# Patient Record
Sex: Male | Born: 1950 | Race: White | Hispanic: No | Marital: Married | Smoking: Former smoker
Health system: Southern US, Community
[De-identification: ages and names within clinical notes are randomized; demographics above are authoritative.]

## PROBLEM LIST (undated history)

## (undated) DIAGNOSIS — IMO0001 Reserved for inherently not codable concepts without codable children: Secondary | ICD-10-CM

## (undated) DIAGNOSIS — M1990 Unspecified osteoarthritis, unspecified site: Secondary | ICD-10-CM

## (undated) DIAGNOSIS — I82409 Acute embolism and thrombosis of unspecified deep veins of unspecified lower extremity: Secondary | ICD-10-CM

## (undated) DIAGNOSIS — G4730 Sleep apnea, unspecified: Secondary | ICD-10-CM

## (undated) DIAGNOSIS — D682 Hereditary deficiency of other clotting factors: Secondary | ICD-10-CM

## (undated) DIAGNOSIS — K56609 Unspecified intestinal obstruction, unspecified as to partial versus complete obstruction: Secondary | ICD-10-CM

## (undated) HISTORY — PX: SHOULDER SURGERY: SHX246

## (undated) HISTORY — PX: DIAGNOSTIC LAPAROSCOPY: SUR761

## (undated) HISTORY — PX: COLONOSCOPY: SHX174

## (undated) HISTORY — PX: HERNIA REPAIR: SHX51

---

## 2003-12-16 ENCOUNTER — Ambulatory Visit (HOSPITAL_BASED_OUTPATIENT_CLINIC_OR_DEPARTMENT_OTHER): Admission: RE | Admit: 2003-12-16 | Discharge: 2003-12-16 | Payer: Self-pay | Admitting: Family Medicine

## 2004-08-22 ENCOUNTER — Emergency Department (HOSPITAL_COMMUNITY): Admission: EM | Admit: 2004-08-22 | Discharge: 2004-08-22 | Payer: Self-pay | Admitting: Emergency Medicine

## 2004-09-24 ENCOUNTER — Encounter: Admission: RE | Admit: 2004-09-24 | Discharge: 2004-09-24 | Payer: Self-pay | Admitting: Gastroenterology

## 2004-09-28 ENCOUNTER — Ambulatory Visit (HOSPITAL_COMMUNITY): Admission: RE | Admit: 2004-09-28 | Discharge: 2004-09-28 | Payer: Self-pay | Admitting: Gastroenterology

## 2005-03-22 ENCOUNTER — Encounter: Admission: RE | Admit: 2005-03-22 | Discharge: 2005-03-22 | Payer: Self-pay | Admitting: Family Medicine

## 2005-03-22 ENCOUNTER — Emergency Department (HOSPITAL_COMMUNITY): Admission: EM | Admit: 2005-03-22 | Discharge: 2005-03-22 | Payer: Self-pay | Admitting: Emergency Medicine

## 2005-04-03 ENCOUNTER — Ambulatory Visit: Payer: Self-pay | Admitting: Oncology

## 2005-11-04 ENCOUNTER — Ambulatory Visit: Payer: Self-pay | Admitting: Oncology

## 2009-09-02 ENCOUNTER — Inpatient Hospital Stay (HOSPITAL_COMMUNITY): Admission: EM | Admit: 2009-09-02 | Discharge: 2009-09-04 | Payer: Self-pay | Admitting: Emergency Medicine

## 2009-10-03 ENCOUNTER — Ambulatory Visit: Payer: Self-pay | Admitting: Oncology

## 2009-10-04 LAB — CBC WITH DIFFERENTIAL/PLATELET
BASO%: 1.2 % (ref 0.0–2.0)
Basophils Absolute: 0.1 10*3/uL (ref 0.0–0.1)
EOS%: 4.8 % (ref 0.0–7.0)
Eosinophils Absolute: 0.3 10*3/uL (ref 0.0–0.5)
HCT: 44.2 % (ref 38.4–49.9)
HGB: 15.4 g/dL (ref 13.0–17.1)
LYMPH%: 39.1 % (ref 14.0–49.0)
MCH: 31.7 pg (ref 27.2–33.4)
MCHC: 34.8 g/dL (ref 32.0–36.0)
MCV: 91.3 fL (ref 79.3–98.0)
MONO#: 0.4 10*3/uL (ref 0.1–0.9)
MONO%: 6.8 % (ref 0.0–14.0)
NEUT#: 2.9 10*3/uL (ref 1.5–6.5)
NEUT%: 48.1 % (ref 39.0–75.0)
Platelets: 167 10*3/uL (ref 140–400)
RBC: 4.84 10*6/uL (ref 4.20–5.82)
RDW: 12.7 % (ref 11.0–14.6)
WBC: 5.9 10*3/uL (ref 4.0–10.3)
lymph#: 2.3 10*3/uL (ref 0.9–3.3)

## 2009-10-04 LAB — PROTIME-INR
INR: 1.5 — ABNORMAL LOW (ref 2.00–3.50)
Protime: 18 Seconds — ABNORMAL HIGH (ref 10.6–13.4)

## 2009-10-11 LAB — CBC WITH DIFFERENTIAL/PLATELET
BASO%: 0.6 % (ref 0.0–2.0)
Basophils Absolute: 0 10*3/uL (ref 0.0–0.1)
EOS%: 5.7 % (ref 0.0–7.0)
Eosinophils Absolute: 0.4 10*3/uL (ref 0.0–0.5)
HCT: 42.9 % (ref 38.4–49.9)
HGB: 14.7 g/dL (ref 13.0–17.1)
LYMPH%: 33.6 % (ref 14.0–49.0)
MCH: 30.7 pg (ref 27.2–33.4)
MCHC: 34.3 g/dL (ref 32.0–36.0)
MCV: 89.6 fL (ref 79.3–98.0)
MONO#: 0.5 10*3/uL (ref 0.1–0.9)
MONO%: 8.6 % (ref 0.0–14.0)
NEUT#: 3.2 10*3/uL (ref 1.5–6.5)
NEUT%: 51.5 % (ref 39.0–75.0)
Platelets: 186 10*3/uL (ref 140–400)
RBC: 4.79 10*6/uL (ref 4.20–5.82)
RDW: 12.3 % (ref 11.0–14.6)
WBC: 6.2 10*3/uL (ref 4.0–10.3)
lymph#: 2.1 10*3/uL (ref 0.9–3.3)

## 2009-10-11 LAB — PROTIME-INR
INR: 1.5 — ABNORMAL LOW (ref 2.00–3.50)
Protime: 18 Seconds — ABNORMAL HIGH (ref 10.6–13.4)

## 2009-10-16 LAB — PROTIME-INR
INR: 1.9 — ABNORMAL LOW (ref 2.00–3.50)
Protime: 22.8 Seconds — ABNORMAL HIGH (ref 10.6–13.4)

## 2009-10-24 LAB — PROTIME-INR
INR: 2 (ref 2.00–3.50)
Protime: 24 Seconds — ABNORMAL HIGH (ref 10.6–13.4)

## 2009-11-03 ENCOUNTER — Ambulatory Visit: Payer: Self-pay | Admitting: Oncology

## 2009-11-07 LAB — PROTIME-INR
INR: 2.1 (ref 2.00–3.50)
Protime: 25.2 Seconds — ABNORMAL HIGH (ref 10.6–13.4)

## 2010-11-11 LAB — URINALYSIS, ROUTINE W REFLEX MICROSCOPIC
Bilirubin Urine: NEGATIVE
Glucose, UA: NEGATIVE mg/dL
Hgb urine dipstick: NEGATIVE
Ketones, ur: 40 mg/dL — AB
Leukocytes, UA: NEGATIVE
Nitrite: NEGATIVE
Protein, ur: 30 mg/dL — AB
Specific Gravity, Urine: 1.026 (ref 1.005–1.030)
Urobilinogen, UA: 1 mg/dL (ref 0.0–1.0)
pH: 7.5 (ref 5.0–8.0)

## 2010-11-11 LAB — COMPREHENSIVE METABOLIC PANEL
ALT: 35 U/L (ref 0–53)
AST: 26 U/L (ref 0–37)
Albumin: 4.1 g/dL (ref 3.5–5.2)
Alkaline Phosphatase: 70 U/L (ref 39–117)
BUN: 15 mg/dL (ref 6–23)
CO2: 30 mEq/L (ref 19–32)
Calcium: 10.8 mg/dL — ABNORMAL HIGH (ref 8.4–10.5)
Chloride: 97 mEq/L (ref 96–112)
Creatinine, Ser: 1.14 mg/dL (ref 0.4–1.5)
GFR calc Af Amer: >60 mL/min (ref >60)
GFR calc non Af Amer: >60 mL/min (ref >60)
Glucose, Bld: 131 mg/dL — ABNORMAL HIGH (ref 70–99)
Potassium: 3.7 mEq/L (ref 3.5–5.1)
Sodium: 138 mEq/L (ref 135–145)
Total Bilirubin: 1.5 mg/dL — ABNORMAL HIGH (ref 0.3–1.2)
Total Protein: 7.8 g/dL (ref 6.0–8.3)

## 2010-11-11 LAB — CBC
HCT: 38.7 % — ABNORMAL LOW (ref 39.0–52.0)
HCT: 41 % (ref 39.0–52.0)
HCT: 51.3 % (ref 39.0–52.0)
Hemoglobin: 13.5 g/dL (ref 13.0–17.0)
Hemoglobin: 14.1 g/dL (ref 13.0–17.0)
Hemoglobin: 17.4 g/dL — ABNORMAL HIGH (ref 13.0–17.0)
MCHC: 33.8 g/dL (ref 30.0–36.0)
MCHC: 33.8 g/dL (ref 30.0–36.0)
MCHC: 34.7 g/dL (ref 30.0–36.0)
MCV: 90.9 fL (ref 78.0–100.0)
MCV: 91.6 fL (ref 78.0–100.0)
MCV: 92.1 fL (ref 78.0–100.0)
Platelets: 192 10*3/uL (ref 150–400)
Platelets: 204 10*3/uL (ref 150–400)
Platelets: 259 10*3/uL (ref 150–400)
RBC: 4.26 MIL/uL (ref 4.22–5.81)
RBC: 4.45 MIL/uL (ref 4.22–5.81)
RBC: 5.6 MIL/uL (ref 4.22–5.81)
RDW: 12.7 % (ref 11.5–15.5)
RDW: 12.8 % (ref 11.5–15.5)
RDW: 12.9 % (ref 11.5–15.5)
WBC: 14.1 10*3/uL — ABNORMAL HIGH (ref 4.0–10.5)
WBC: 14.5 10*3/uL — ABNORMAL HIGH (ref 4.0–10.5)
WBC: 8.3 10*3/uL (ref 4.0–10.5)

## 2010-11-11 LAB — PROTIME-INR
INR: 1.37 (ref 0.00–1.49)
INR: 1.61 — ABNORMAL HIGH (ref 0.00–1.49)
Prothrombin Time: 16.8 seconds — ABNORMAL HIGH (ref 11.6–15.2)
Prothrombin Time: 19 seconds — ABNORMAL HIGH (ref 11.6–15.2)

## 2010-11-11 LAB — DIFFERENTIAL
Basophils Absolute: 0.1 10*3/uL (ref 0.0–0.1)
Basophils Relative: 1 % (ref 0–1)
Eosinophils Absolute: 0 10*3/uL (ref 0.0–0.7)
Eosinophils Relative: 0 % (ref 0–5)
Lymphocytes Relative: 14 % (ref 12–46)
Lymphs Abs: 2 10*3/uL (ref 0.7–4.0)
Monocytes Absolute: 0.8 10*3/uL (ref 0.1–1.0)
Monocytes Relative: 6 % (ref 3–12)
Neutro Abs: 11.6 10*3/uL — ABNORMAL HIGH (ref 1.7–7.7)
Neutrophils Relative %: 80 % — ABNORMAL HIGH (ref 43–77)

## 2010-11-11 LAB — BASIC METABOLIC PANEL
BUN: 15 mg/dL (ref 6–23)
CO2: 32 mEq/L (ref 19–32)
Calcium: 8.7 mg/dL (ref 8.4–10.5)
Chloride: 103 mEq/L (ref 96–112)
Creatinine, Ser: 1.07 mg/dL (ref 0.4–1.5)
GFR calc Af Amer: >60 mL/min (ref >60)
GFR calc non Af Amer: >60 mL/min (ref >60)
Glucose, Bld: 115 mg/dL — ABNORMAL HIGH (ref 70–99)
Potassium: 4.2 mEq/L (ref 3.5–5.1)
Sodium: 139 mEq/L (ref 135–145)

## 2010-11-11 LAB — URINE MICROSCOPIC-ADD ON
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
## 2013-04-29 ENCOUNTER — Telehealth: Payer: Self-pay | Admitting: *Deleted

## 2013-04-29 NOTE — Telephone Encounter (Signed)
Received vm call from Katie/Dr. Earl Gala regarding need for f/u & asking if new referral needs to be sent or could f/u be scheduled without new referral.  Returned call today @ 11am & Florentina Addison states that Dr Earl Gala has dictated a note to Dr Cyndie Chime requesting f/u for NAC & lovenox bridge for colonoscopy.  Informed that we would discuss with Dr Cyndie Chime next week upon his return & she will let pt know.  She reports that she did fax a referral & Dr Newell Coral note this am.  Note to Dr Cyndie Chime.

## 2013-05-31 ENCOUNTER — Telehealth: Payer: Self-pay | Admitting: Oncology

## 2013-05-31 NOTE — Telephone Encounter (Signed)
CALLED TO SCHEDULE APPT PER PT WILL NOT NEED TO MAKE APPT. WILL NOTIFY REFERRAL OFFICE.

## 2013-06-08 ENCOUNTER — Ambulatory Visit: Payer: Self-pay

## 2013-06-08 ENCOUNTER — Encounter: Payer: Self-pay | Admitting: Oncology

## 2013-10-23 ENCOUNTER — Encounter: Payer: Self-pay | Admitting: Oncology

## 2015-05-09 ENCOUNTER — Encounter (HOSPITAL_COMMUNITY)
Admission: RE | Admit: 2015-05-09 | Discharge: 2015-05-09 | Disposition: A | Discharge status: Home / Self Care | Payer: BLUE CROSS/BLUE SHIELD | Source: Ambulatory Visit | Attending: Orthopedic Surgery | Admitting: Orthopedic Surgery

## 2015-05-09 ENCOUNTER — Encounter (HOSPITAL_COMMUNITY): Payer: Self-pay

## 2015-05-09 DIAGNOSIS — M7541 Impingement syndrome of right shoulder: Secondary | ICD-10-CM | POA: Diagnosis not present

## 2015-05-09 DIAGNOSIS — M19011 Primary osteoarthritis, right shoulder: Secondary | ICD-10-CM | POA: Insufficient documentation

## 2015-05-09 DIAGNOSIS — Z01812 Encounter for preprocedural laboratory examination: Secondary | ICD-10-CM | POA: Insufficient documentation

## 2015-05-09 HISTORY — DX: Hereditary deficiency of other clotting factors: D68.2

## 2015-05-09 HISTORY — DX: Acute embolism and thrombosis of unspecified deep veins of unspecified lower extremity: I82.409

## 2015-05-09 HISTORY — DX: Reserved for inherently not codable concepts without codable children: IMO0001

## 2015-05-09 HISTORY — DX: Unspecified osteoarthritis, unspecified site: M19.90

## 2015-05-09 LAB — CBC WITH DIFFERENTIAL/PLATELET
BASOS ABS: 0 10*3/uL (ref 0.0–0.1)
BASOS PCT: 1 % (ref 0–1)
EOS PCT: 4 % (ref 0–5)
Eosinophils Absolute: 0.3 10*3/uL (ref 0.0–0.7)
HCT: 45.1 % (ref 39.0–52.0)
Hemoglobin: 15.8 g/dL (ref 13.0–17.0)
Lymphocytes Relative: 45 % (ref 12–46)
Lymphs Abs: 3 10*3/uL (ref 0.7–4.0)
MCH: 32 pg (ref 26.0–34.0)
MCHC: 35 g/dL (ref 30.0–36.0)
MCV: 91.3 fL (ref 78.0–100.0)
MONO ABS: 0.7 10*3/uL (ref 0.1–1.0)
Monocytes Relative: 10 % (ref 3–12)
Neutro Abs: 2.6 10*3/uL (ref 1.7–7.7)
Neutrophils Relative %: 40 % — ABNORMAL LOW (ref 43–77)
PLATELETS: 161 10*3/uL (ref 150–400)
RBC: 4.94 MIL/uL (ref 4.22–5.81)
RDW: 12.5 % (ref 11.5–15.5)
WBC: 6.6 10*3/uL (ref 4.0–10.5)

## 2015-05-09 LAB — COMPREHENSIVE METABOLIC PANEL
ALBUMIN: 4 g/dL (ref 3.5–5.0)
ALT: 24 U/L (ref 17–63)
AST: 22 U/L (ref 15–41)
Alkaline Phosphatase: 60 U/L (ref 38–126)
Anion gap: 7 (ref 5–15)
BUN: 12 mg/dL (ref 6–20)
CHLORIDE: 104 mmol/L (ref 101–111)
CO2: 27 mmol/L (ref 22–32)
Calcium: 9.1 mg/dL (ref 8.9–10.3)
Creatinine, Ser: 1.06 mg/dL (ref 0.61–1.24)
GFR calc Af Amer: >60 mL/min (ref >60)
GFR calc non Af Amer: >60 mL/min (ref >60)
GLUCOSE: 94 mg/dL (ref 65–99)
POTASSIUM: 4 mmol/L (ref 3.5–5.1)
Sodium: 138 mmol/L (ref 135–145)
Total Bilirubin: 0.9 mg/dL (ref 0.3–1.2)
Total Protein: 6.8 g/dL (ref 6.5–8.1)

## 2015-05-09 LAB — APTT: APTT: 37 s (ref 24–37)

## 2015-05-09 NOTE — Pre-Procedure Instructions (Addendum)
    Shawn Farley  05/09/2015    Your procedure is scheduled on Thursday, Septmeber 22..  Report to [facility] Admitting at 10:50 A.M.               Your surgery is scheduled for 12:50am   Call this number if you have problems the morning of surgery:[phone]                   For any other questions, please call [phone], Monday - Friday 8 AM - 4 PM.   Remember:  Do not eat food or drink liquids after midnight Wednesday, September 21.   Take these medicines the morning of surgery with A SIP OF WATER : None                  Stop Coumadin 4 days prior to surgery and have PT/INR drawn in Dr Kevan Ny office on September 21.    Do not wear jewelry, make-up or nail polish.   Do not wear lotions, powders, or perfumes.  You may wear deodorant.   Do not shave 48 hours prior to surgery.  Men may shave face and neck.   Do not bring valuables to the hospital.   [facility] is not responsible for any belongings or valuables.  Contacts, dentures or bridgework may not be worn into surgery.  Leave your suitcase in the car.  After surgery it may be brought to your room.  For patients admitted to the hospital, discharge time will be determined by your treatment team.  Patients discharged the day of surgery will not be allowed to drive home.   Name and phone number of your driver:   - Special instructions:  Review  [facility] - Preparing For Surgery.  Please read over the following fact sheets that you were given. Pain Booklet, Coughing and Deep Breathing and Surgical Site Infection Prevention

## 2015-05-09 NOTE — Progress Notes (Signed)
I read to Shawn Farley the instructions from Dr Jerelyn Scott regarding stopping Coumadin and when to begin Levonox,.  The instructions were sent to Dr Rennis Chris and then forwarded to Shawn Farley.  Shawn Farley is concerned about being totally off blood thinner.  I called Dr Kevan Ny' office and Dr Kevan Ny took the call and was able to speak with patient regarding his concerns.  Shawn Farley is going to Dr Kevan Ny office on 05/11/15 to discuss this further.  Patient is scheduled to go to Dr Kevan Ny office on 05/17/15 to have PT/INR drawn. I faxed a request to Dr Kevan Ny office requesting results of the 05/17/15 PT/INR results.

## 2015-05-18 ENCOUNTER — Ambulatory Visit (HOSPITAL_COMMUNITY): Payer: BLUE CROSS/BLUE SHIELD | Admitting: Anesthesiology

## 2015-05-18 ENCOUNTER — Encounter (HOSPITAL_COMMUNITY): Payer: Self-pay | Admitting: *Deleted

## 2015-05-18 ENCOUNTER — Encounter (HOSPITAL_COMMUNITY)
Admission: RE | Disposition: A | Discharge status: Home / Self Care | Payer: Self-pay | Source: Ambulatory Visit | Attending: Orthopedic Surgery

## 2015-05-18 ENCOUNTER — Ambulatory Visit (HOSPITAL_COMMUNITY)
Admission: RE | Admit: 2015-05-18 | Discharge: 2015-05-18 | Disposition: A | Discharge status: Home / Self Care | Payer: BLUE CROSS/BLUE SHIELD | Source: Ambulatory Visit | Attending: Orthopedic Surgery | Admitting: Orthopedic Surgery

## 2015-05-18 DIAGNOSIS — M19011 Primary osteoarthritis, right shoulder: Secondary | ICD-10-CM | POA: Diagnosis not present

## 2015-05-18 DIAGNOSIS — M7541 Impingement syndrome of right shoulder: Secondary | ICD-10-CM | POA: Insufficient documentation

## 2015-05-18 DIAGNOSIS — Z7901 Long term (current) use of anticoagulants: Secondary | ICD-10-CM | POA: Diagnosis not present

## 2015-05-18 DIAGNOSIS — M75111 Incomplete rotator cuff tear or rupture of right shoulder, not specified as traumatic: Secondary | ICD-10-CM | POA: Diagnosis not present

## 2015-05-18 DIAGNOSIS — F17210 Nicotine dependence, cigarettes, uncomplicated: Secondary | ICD-10-CM | POA: Insufficient documentation

## 2015-05-18 SURGERY — SHOULDER ARTHROSCOPY WITH SUBACROMIAL DECOMPRESSION AND DISTAL CLAVICLE EXCISION
Anesthesia: General | Site: Shoulder | Laterality: Right

## 2015-05-18 MED ORDER — MIDAZOLAM HCL 2 MG/2ML IJ SOLN
2.0000 mg | Freq: Once | INTRAMUSCULAR | Status: AC
  Administered 2015-05-18: 1 mg via INTRAVENOUS
  Filled 2015-05-18: qty 2

## 2015-05-18 MED ORDER — NEOSTIGMINE METHYLSULFATE 10 MG/10ML IV SOLN
INTRAVENOUS | Status: DC | PRN
  Administered 2015-05-18: 4 mg via INTRAVENOUS

## 2015-05-18 MED ORDER — ROCURONIUM BROMIDE 100 MG/10ML IV SOLN
INTRAVENOUS | Status: DC | PRN
  Administered 2015-05-18: 50 mg via INTRAVENOUS

## 2015-05-18 MED ORDER — CEFAZOLIN SODIUM-DEXTROSE 2-3 GM-% IV SOLR
2.0000 g | INTRAVENOUS | Status: AC
  Administered 2015-05-18: 2 g via INTRAVENOUS
  Filled 2015-05-18: qty 50

## 2015-05-18 MED ORDER — ONDANSETRON HCL 4 MG/2ML IJ SOLN
INTRAMUSCULAR | Status: DC | PRN
  Administered 2015-05-18: 4 mg via INTRAVENOUS

## 2015-05-18 MED ORDER — GLYCOPYRROLATE 0.2 MG/ML IJ SOLN
INTRAMUSCULAR | Status: DC | PRN
  Administered 2015-05-18: .6 mg via INTRAVENOUS

## 2015-05-18 MED ORDER — PROPOFOL 10 MG/ML IV BOLUS
INTRAVENOUS | Status: DC | PRN
  Administered 2015-05-18: 200 mg via INTRAVENOUS

## 2015-05-18 MED ORDER — SODIUM CHLORIDE 0.9 % IR SOLN
Status: DC | PRN
  Administered 2015-05-18: 3000 mL

## 2015-05-18 MED ORDER — FENTANYL CITRATE (PF) 250 MCG/5ML IJ SOLN
INTRAMUSCULAR | Status: DC | PRN
  Administered 2015-05-18: 100 ug via INTRAVENOUS

## 2015-05-18 MED ORDER — MIDAZOLAM HCL 5 MG/5ML IJ SOLN
INTRAMUSCULAR | Status: DC | PRN
  Administered 2015-05-18: 2 mg via INTRAVENOUS

## 2015-05-18 MED ORDER — CHLORHEXIDINE GLUCONATE 4 % EX LIQD: 60.0000 mL | Freq: Once | CUTANEOUS | Status: DC

## 2015-05-18 MED ORDER — LACTATED RINGERS IV SOLN
INTRAVENOUS | Status: DC
  Administered 2015-05-18 (×2): via INTRAVENOUS

## 2015-05-18 MED ORDER — PROPOFOL 10 MG/ML IV BOLUS
INTRAVENOUS | Status: AC
  Filled 2015-05-18: qty 20

## 2015-05-18 MED ORDER — FENTANYL CITRATE (PF) 100 MCG/2ML IJ SOLN
100.0000 ug | Freq: Once | INTRAMUSCULAR | Status: AC
  Administered 2015-05-18: 50 ug via INTRAVENOUS
  Filled 2015-05-18: qty 2

## 2015-05-18 MED ORDER — EPHEDRINE SULFATE 50 MG/ML IJ SOLN
INTRAMUSCULAR | Status: DC | PRN
  Administered 2015-05-18 (×2): 5 mg via INTRAVENOUS
  Administered 2015-05-18: 10 mg via INTRAVENOUS

## 2015-05-18 MED ORDER — DIAZEPAM 5 MG PO TABS: 2.5000 mg | ORAL_TABLET | Freq: Four times a day (QID) | ORAL | Status: DC | PRN

## 2015-05-18 MED ORDER — ONDANSETRON HCL 4 MG PO TABS: 4.0000 mg | ORAL_TABLET | Freq: Three times a day (TID) | ORAL | Status: DC | PRN

## 2015-05-18 MED ORDER — OXYCODONE-ACETAMINOPHEN 5-325 MG PO TABS: 1.0000 | ORAL_TABLET | ORAL | Status: DC | PRN

## 2015-05-18 MED ORDER — LACTATED RINGERS IV SOLN: INTRAVENOUS | Status: DC

## 2015-05-18 MED ORDER — BUPIVACAINE-EPINEPHRINE (PF) 0.5% -1:200000 IJ SOLN
INTRAMUSCULAR | Status: DC | PRN
  Administered 2015-05-18: 30 mL via PERINEURAL

## 2015-05-18 MED ORDER — FENTANYL CITRATE (PF) 250 MCG/5ML IJ SOLN
INTRAMUSCULAR | Status: AC
  Filled 2015-05-18: qty 5

## 2015-05-18 MED ORDER — SODIUM CHLORIDE 0.9 % IR SOLN
Status: DC | PRN
  Administered 2015-05-18: 1000 mL

## 2015-05-18 MED ORDER — LIDOCAINE HCL (CARDIAC) 20 MG/ML IV SOLN
INTRAVENOUS | Status: DC | PRN
  Administered 2015-05-18: 40 mg via INTRAVENOUS

## 2015-05-18 MED ORDER — MIDAZOLAM HCL 2 MG/2ML IJ SOLN
INTRAMUSCULAR | Status: AC
  Filled 2015-05-18: qty 2

## 2015-05-18 SURGICAL SUPPLY — 61 items
ANCH SUT SWLK 19.1X4.75 VT (Anchor) ×3 IMPLANT
ANCHOR PEEK 4.75X19.1 SWLK C (Anchor) ×3 IMPLANT
BLADE CUTTER GATOR 3.5 (BLADE) ×2 IMPLANT
BLADE GREAT WHITE 4.2 (BLADE) ×2 IMPLANT
BLADE SURG 11 STRL SS (BLADE) ×2 IMPLANT
BOOTCOVER CLEANROOM LRG (PROTECTIVE WEAR) ×2 IMPLANT
BUR OVAL 4.0 (BURR) ×2 IMPLANT
CANISTER SUCT LVC 12 LTR MEDI- (MISCELLANEOUS) ×2 IMPLANT
CANNULA ACUFLEX KIT 5X76 (CANNULA) ×2 IMPLANT
CANNULA DRILOCK 5.0X75 (CANNULA) ×2 IMPLANT
CANNULA TWIST IN 8.25X7CM (CANNULA) ×1 IMPLANT
CLSR STERI-STRIP ANTIMIC 1/2X4 (GAUZE/BANDAGES/DRESSINGS) ×1 IMPLANT
CONNECTOR 5 IN 1 STRAIGHT STRL (MISCELLANEOUS) ×2 IMPLANT
DRAPE INCISE 23X17 IOBAN STRL (DRAPES)
DRAPE INCISE 23X17 STRL (DRAPES) IMPLANT
DRAPE INCISE IOBAN 23X17 STRL (DRAPES) IMPLANT
DRAPE INCISE IOBAN 66X45 STRL (DRAPES) ×2 IMPLANT
DRAPE ORTHO SPLIT 77X108 STRL (DRAPES) ×4
DRAPE STERI 35X30 U-POUCH (DRAPES) ×1 IMPLANT
DRAPE SURG 17X11 SM STRL (DRAPES) ×2 IMPLANT
DRAPE SURG ORHT 6 SPLT 77X108 (DRAPES) ×2 IMPLANT
DRAPE U-SHAPE 47X51 STRL (DRAPES) ×1 IMPLANT
DRSG PAD ABDOMINAL 8X10 ST (GAUZE/BANDAGES/DRESSINGS) ×3 IMPLANT
DURAPREP 26ML APPLICATOR (WOUND CARE) ×2 IMPLANT
GAUZE SPONGE 4X4 12PLY STRL (GAUZE/BANDAGES/DRESSINGS) ×1 IMPLANT
GLOVE BIO SURGEON STRL SZ7.5 (GLOVE) ×2 IMPLANT
GLOVE BIO SURGEON STRL SZ8 (GLOVE) ×2 IMPLANT
GLOVE EUDERMIC 7 POWDERFREE (GLOVE) ×2 IMPLANT
GLOVE SS BIOGEL STRL SZ 7.5 (GLOVE) ×1 IMPLANT
GLOVE SUPERSENSE BIOGEL SZ 7.5 (GLOVE) ×1
GOWN STRL REUS W/ TWL LRG LVL3 (GOWN DISPOSABLE) ×1 IMPLANT
GOWN STRL REUS W/ TWL XL LVL3 (GOWN DISPOSABLE) ×2 IMPLANT
GOWN STRL REUS W/TWL LRG LVL3 (GOWN DISPOSABLE) ×2
GOWN STRL REUS W/TWL XL LVL3 (GOWN DISPOSABLE) ×4
KIT BASIN OR (CUSTOM PROCEDURE TRAY) ×2 IMPLANT
KIT ROOM TURNOVER OR (KITS) ×2 IMPLANT
KIT SHOULDER TRACTION (DRAPES) ×2 IMPLANT
MANIFOLD NEPTUNE II (INSTRUMENTS) ×2 IMPLANT
NDL SCORPION MULTI FIRE (NEEDLE) IMPLANT
NDL SPNL 18GX3.5 QUINCKE PK (NEEDLE) ×1 IMPLANT
NEEDLE SCORPION MULTI FIRE (NEEDLE) ×2 IMPLANT
NEEDLE SPNL 18GX3.5 QUINCKE PK (NEEDLE) ×2 IMPLANT
NS IRRIG 1000ML POUR BTL (IV SOLUTION) ×1 IMPLANT
PACK SHOULDER (CUSTOM PROCEDURE TRAY) ×2 IMPLANT
PAD ARMBOARD 7.5X6 YLW CONV (MISCELLANEOUS) ×5 IMPLANT
SET ARTHROSCOPY TUBING (MISCELLANEOUS) ×2
SET ARTHROSCOPY TUBING LN (MISCELLANEOUS) ×1 IMPLANT
SLING ARM LRG ADULT FOAM STRAP (SOFTGOODS) IMPLANT
SLING ARM MED ADULT FOAM STRAP (SOFTGOODS) ×1 IMPLANT
SPONGE GAUZE 4X4 12PLY STER LF (GAUZE/BANDAGES/DRESSINGS) ×1 IMPLANT
SPONGE LAP 4X18 X RAY DECT (DISPOSABLE) ×1 IMPLANT
STRIP CLOSURE SKIN 1/2X4 (GAUZE/BANDAGES/DRESSINGS) ×1 IMPLANT
SUT MNCRL AB 3-0 PS2 18 (SUTURE) ×2 IMPLANT
SUT PDS AB 0 CT 36 (SUTURE) IMPLANT
SUT TIGER TAPE 7 IN WHITE (SUTURE) ×1 IMPLANT
SYR 20CC LL (SYRINGE) IMPLANT
TAPE PAPER 3X10 WHT MICROPORE (GAUZE/BANDAGES/DRESSINGS) ×1 IMPLANT
TOWEL OR 17X24 6PK STRL BLUE (TOWEL DISPOSABLE) ×2 IMPLANT
TOWEL OR 17X26 10 PK STRL BLUE (TOWEL DISPOSABLE) ×2 IMPLANT
WAND SUCTION MAX 4MM 90S (SURGICAL WAND) ×2 IMPLANT
WATER STERILE IRR 1000ML POUR (IV SOLUTION) ×2 IMPLANT

## 2015-05-18 NOTE — Discharge Instructions (Signed)
° °  Kevin M. Supple, M.D., F.A.A.O.S. °Orthopaedic Surgery °Specializing in Arthroscopic and Reconstructive °Surgery of the Shoulder and Knee °[phone] °[address] - [place], [place] [address] - Fax [phone] ° °POST-OP SHOULDER ARTHROSCOPIC ROTATOR CUFF AND/OR LABRAL REPAIR INSTRUCTIONS ° °1. Call the office at [phone] to schedule your first post-op appointment 7-10 days from the date of your surgery. ° °2. Leave the steri-strips in place over your incisions when performing dressing changes and showering. You may remove your dressings and begin showering 72 hours from surgery. You can expect drainage that is clear to bloody in nature that occasionally will soak through your dressings. If this occurs go ahead and perform a dressing change. The drainage should lessen daily and when there is no drainage from your incisions feel free to go without a dressing. ° °3. Wear your sling/immobilizer at all times except to perform the exercises below or to occasionally let your arm dangle by your side to stretch your elbow. You also need to sleep in your sling immobilizer until instructed otherwise. ° °4. Range of motion to your elbow, wrist, and hand are encouraged 3-5 times daily. Exercise to your hand and fingers helps to reduce swelling you may experience. ° °5. Utilize ice to the shoulder 3-4 times minimum a day and additionally if you are experiencing pain. ° °6. You may one-armed drive when safely off of narcotics and muscle relaxants. You may use your hand that is in the sling to support the steering wheel only. However, should it be your right arm that is in the sling it is not to be used for gear shifting in a manual transmission. ° °7. If you had a block pre-operatively to provide post-op pain relief you may want to go ahead and begin utilizing your pain meds as your arm begins to wake up. Blocks can sometimes last up to 16-18 hours. If you are still pain-free prior to going to bed you may  want to strongly consider taking a pain medication to avoid being awakened in the night with the onset of pain. A muscle relaxant is also provided for you should you experience muscle spasms. It is recommended that if you are experiencing pain that your pain medication alone is not controlling, add the muscle relaxant along with the pain medication which can give additional pain relief. The first one to two days is generally the most severe of your pain and then should gradually decrease. As your pain lessens it is recommended that you decrease your use of the pain medications to an "as needed basis" only and to always comply with the recommended dosages of the pain medications. ° °8. Pain medications can produce constipation along with their use. If you experience this, the use of an over the counter stool softener or laxative daily is recommended.  ° °9. For additional questions or concerns, please do not hesitate to call the office. If after hours there is an answering service to forward your concerns to the physician on call. ° °POST-OP EXERCISES ° °Pendulum Exercises ° °Perform pendulum exercises while standing and bending at the waist. Support your uninvolved arm on a table or chair and allow your operated arm to hang freely. Make sure to do these exercises passively - not using you shoulder muscle. ° °Repeat 20 times. Do 3 sessions per day. ° ° °

## 2015-05-18 NOTE — Anesthesia Procedure Notes (Addendum)
Anesthesia Regional Block:  Interscalene brachial plexus block  Pre-Anesthetic Checklist: ,, timeout performed, Correct Patient, Correct Site, Correct Laterality, Correct Procedure, Correct Position, site marked, Risks and benefits discussed, Surgical consent,  Pre-op evaluation,  At surgeon's request  Laterality: Right and Upper  Prep: chloraprep       Needles:  Injection technique: Single-shot  Needle Type: Echogenic Stimulator Needle     Needle Length: 5cm 5 cm Needle Gauge: 22 and 22 G    Additional Needles:  Procedures: nerve stimulator Interscalene brachial plexus block  Nerve Stimulator or Paresthesia:  Response: forearm twitch, 0.45 mA, 0.1 ms,   Additional Responses:   Narrative:  Start time: 05/18/2015 12:24 PM End time: 05/18/2015 12:30 PM Injection made incrementally with aspirations every 5 mL.  Performed by: Personally  Anesthesiologist: Jean Rosenthal, CARSWELL  Additional Notes: Pt identified in Holding room.  Monitors applied. Working IV access confirmed. Sterile prep R neck.  #22ga PNS to forearm twitch at 0.56mA threshold.  30cc 0.5% Bupivacaine with 1:200k epi injected incrementally after negative test dose.  Patient asymptomatic, VSS, no heme aspirated, tolerated well.  Sandford Craze, MD   Procedure Name: Intubation Date/Time: 05/18/2015 1:41 PM Performed by: Marena Chancy Pre-anesthesia Checklist: Emergency Drugs available, Patient identified, Suction available, Patient being monitored and Timeout performed Patient Re-evaluated:Patient Re-evaluated prior to inductionOxygen Delivery Method: Circle system utilized Preoxygenation: Pre-oxygenation with 100% oxygen Intubation Type: IV induction Laryngoscope Size: Miller and 2 Grade View: Grade I Tube type: Oral Tube size: 7.5 mm Number of attempts: 1 Placement Confirmation: ETT inserted through vocal cords under direct vision,  positive ETCO2 and breath sounds checked- equal and bilateral Tube secured  with: Tape Dental Injury: Teeth and Oropharynx as per pre-operative assessment

## 2015-05-18 NOTE — Op Note (Signed)
05/18/2015  2:44 PM  PATIENT:   Shawn Farley  64 y.o. male  PRE-OPERATIVE DIAGNOSIS:  RIGHT SHOULDER IMPINGEMENT, AC joint OA, partial vs full thickness RCT  POST-OPERATIVE DIAGNOSIS:  same  PROCEDURE:  RSA, SAD,DCR,RCR  SURGEON:  Supple, Vania Rea. M.D.  ASSISTANTS: Shuford pac   ANESTHESIA:   GET + ISB  EBL: min  SPECIMEN:  none  Drains: none   PATIENT DISPOSITION:  PACU - hemodynamically stable.    PLAN OF CARE: Discharge to home after PACU  Dictation# [id number]   Contact # [phone]

## 2015-05-18 NOTE — Transfer of Care (Signed)
Immediate Anesthesia Transfer of Care Note  Patient: Shawn Farley  Procedure(s) Performed: Procedure(s): RIGHT SHOULDER ARTHROSCOPY WITH SUBACROMIAL DECOMPRESSION,  DISTAL CLAVICAL RESECTION,POSSIBLE ROTATOR CUFF REPAIR  (Right)  Patient Location: PACU  Anesthesia Type:GA combined with regional for post-op pain  Level of Consciousness: awake, alert  and oriented  Airway & Oxygen Therapy: Patient Spontanous Breathing and Patient connected to nasal cannula oxygen  Post-op Assessment: Report given to RN and Post -op Vital signs reviewed and stable  Post vital signs: Reviewed and stable  Last Vitals:  Filed Vitals:   05/18/15 1240  BP: 119/62  Pulse: 69  Temp:   Resp: 16    Complications: No apparent anesthesia complications

## 2015-05-18 NOTE — Anesthesia Preprocedure Evaluation (Addendum)
Anesthesia Evaluation  Patient identified by MRN, date of birth, ID band Patient awake    Reviewed: Allergy & Precautions, NPO status , Patient's Chart, lab work & pertinent test results  History of Anesthesia Complications Negative for: history of anesthetic complications  Airway Mallampati: II  TM Distance: >3 FB Neck ROM: Full    Dental  (+) Dental Advisory Given   Pulmonary sleep apnea and Continuous Positive Airway Pressure Ventilation , COPD, Current Smoker,    breath sounds clear to auscultation       Cardiovascular + DVT   Rhythm:Regular Rate:Normal     Neuro/Psych negative neurological ROS     GI/Hepatic negative GI ROS, Neg liver ROS,   Endo/Other  negative endocrine ROS  Renal/GU negative Renal ROS     Musculoskeletal   Abdominal   Peds  Hematology  (+) Blood dyscrasia (Factor V Leiden (off Coumadin x5d), INR 1.1), ,   Anesthesia Other Findings   Reproductive/Obstetrics                            Anesthesia Physical Anesthesia Plan  ASA: III  Anesthesia Plan: General   Post-op Pain Management: GA combined w/ Regional for post-op pain   Induction: Intravenous  Airway Management Planned: Oral ETT  Additional Equipment:   Intra-op Plan:   Post-operative Plan: Extubation in OR  Informed Consent: I have reviewed the patients History and Physical, chart, labs and discussed the procedure including the risks, benefits and alternatives for the proposed anesthesia with the patient or authorized representative who has indicated his/her understanding and acceptance.   Dental advisory given  Plan Discussed with: CRNA and Surgeon  Anesthesia Plan Comments: (Plan routine monitors, GETA with interscalene block for post op analgesia)        Anesthesia Quick Evaluation
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
## 2015-05-18 NOTE — Anesthesia Postprocedure Evaluation (Signed)
  Anesthesia Post-op Note  Patient: Shawn Farley  Procedure(s) Performed: Procedure(s): RIGHT SHOULDER ARTHROSCOPY WITH SUBACROMIAL DECOMPRESSION,  DISTAL CLAVICAL RESECTION,POSSIBLE ROTATOR CUFF REPAIR  (Right)  Patient Location: PACU  Anesthesia Type:GA combined with regional for post-op pain  Level of Consciousness: awake, alert , oriented and patient cooperative  Airway and Oxygen Therapy: Patient Spontanous Breathing  Post-op Pain: none  Post-op Assessment: Post-op Vital signs reviewed, Patient's Cardiovascular Status Stable, Respiratory Function Stable, Patent Airway, No signs of Nausea or vomiting and Pain level controlled              Post-op Vital Signs: Reviewed and stable  Last Vitals:  Filed Vitals:   05/18/15 1533  BP: 113/70  Pulse: 64  Temp: 36.4 C  Resp:     Complications: No apparent anesthesia complications

## 2015-05-19 NOTE — Op Note (Signed)
NAME:  Shawn Farley, NULL NO.:  [id number]  MEDICAL RECORD NO.:  [id number]  LOCATION:  [facility]                         FACILITY:  [facility]  PHYSICIAN:  Vania Rea. Supple, M.D.  DATE OF BIRTH:  May 14, 1951  DATE OF PROCEDURE:  05/18/2015 DATE OF DISCHARGE:  05/18/2015                              OPERATIVE REPORT   PREOPERATIVE DIAGNOSES: 1. 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 2. Right shoulder symptomatic AC joint arthropathy. 3. Partial versus full-thickness rotator cuff tear.  POSTOPERATIVE DIAGNOSES: 1. Right shoulder chronic impingement. 2. Right shoulder AC joint arthropathy. 3. Right shoulder partial rotator cuff tear involving greater than 90%     of the thickness of the tendon.  PROCEDURE: 1. Right shoulder examination under anesthesia. 2. Right shoulder glenohumeral joint diagnostic arthroscopy. 3. Arthroscopic subacromial decompression and bursectomy. 4. Arthroscopic distal clavicle resection. 5. Arthroscopic rotator cuff repair using a double-row suture bridge     repair construct.  SURGEON:  Vania Rea. Supple, M.D.  ASSISTANT:  Lucita Lora Shuford, PA-C.  ANESTHESIA:  LMA, general, as well as interscalene block.  ESTIMATED BLOOD LOSS:  Minimal.  DRAINS:  None.  HISTORY:  Mr. Capo is a 64 year old gentleman who has had chronic and progressive increasing right shoulder pain related to an impingement syndrome with an MRI scan as well as plain radiographs confirming advanced AC joint arthropathy and MRI scan showing significant partial bursal-sided rotator cuff tear, felt to be at least 70 to 80% of the thickness of the tendon.  Due to his ongoing pain and functional limitations, he was brought to the operating room at this time for planned right shoulder arthroscopy as described below.  Preoperatively, I counseled Mr. Komorowski regarding treatment options and potential risks versus benefits thereof.  Possible surgical complications  were all reviewed including bleeding, infection, neurovascular injury, persistent pain, loss of motion, anesthetic complication, recurrence of rotator cuff tear and possible need for additional surgery.  He understands and accepts and agrees with our planned procedure.  PROCEDURE IN DETAIL:  After undergoing routine preop evaluation, the patient received prophylactic antibiotics.  An interscalene block was established in the holding area by the Anesthesia Department.  Placed supine on the operating table.  Underwent smooth induction of general endotracheal anesthesia.  Turned to left lateral decubitus position on a beanbag and appropriately padded and protected.  Right shoulder examination under anesthesia revealed some modest restrictions in mobility and there was significant crepitus noted with passive motion emanating from the subacromial space.  Achieved  approximately 150 degrees of elevation and with manipulation did not achieve any significant increase in mobility.  Right arm was then suspended at 70 degrees of abduction with 10 pounds of traction.  Right shoulder girdle region was sterilely prepped and draped in standard fashion.  Time-out was called.  Posterior portal was established in the glenohumeral joint and diagnostic arthroscopy was performed.  The articular surfaces were all found to be in excellent condition.  The capsular volume was within normal limits.  No obvious instability patterns __________ stable and no labral tears.  The rotator cuff was inspected from the articular side did not show any obvious full-thickness defects.  There  was some fraying at the level of the distal supraspinatus and I suspect this is the area that was immediately adjacent to the bursal side tear, which we ultimately identified __________ bursal space, but certainly no obvious full-thickness defects and visualized from articular side.  Remaining inspection on the glenohumeral joint showed  no obvious additional pathologies.  Fluid and instruments were then removed.  The arm was dropped down to 30 degrees of abduction.  Arthroscope introduced into the subacromial space of the posterior portal and a direct lateral portal in the subacromial space.  Abundant dense bursal tissue, multiple adhesions were encountered.  These were all divided and excised __________ shaver and Stryker wand.  Wand was then used to remove the periosteum from the undersurface of the anterior half of the acromion. The subacromial decompression was performed with a bur creating a type 1 morphology.  Note, there was marked bony impingement and this was nicely decompressed.  Portals then established directly anterior to the distal clavicle and distal clavicle resection was performed with a bur.  Care was taken to confirm visualization of the entire circumference of the distal clavicle to ensure adequate removal of bone.  We then completed the subacromial/subdeltoid bursectomy.  The rotator cuff was inspected there was readily identified tear involving the distal supraspinatus, which we debrided, prepared the greater tuberosity and also completed the defect removing the residual perhaps 10% of the thickness through the articular-sided fibers.  The rotator cuff margin was trimmed back to healthy tissue.  Bone was debrided to bleeding bed.  __________ Arthrex Peek, SwiveLock suture, anchor loaded with fiber tape and all 4 suture limbs were then shuttled __________ across the width of the rotator cuff tear using a scorpion suture passer.  Then, we then placed 2 anchors for lateral row and this nicely compressed the rotator cuff margin against bony bed and tuberosity, and overall construct was much to our satisfaction.  Suture limbs were all clipped.  Final inspection was completed.  Hemostasis was obtained.  Fluid was removed.  Portals closed with Monocryl, Steri-Strip by dry dressing taped about the  right shoulder, were then placed in a sling.  The patient was awakened, extubated, and taken to recovery room in stable condition.  Ralene Bathe, PA-C, was used as an Geophysicist/field seismologist throughout this case, was essential for help with positioning of the patient, positioning of the extremity, management __________ suture management, wound closure, and intraoperative decision making.     Vania Rea. Supple, M.D.     KMS/MEDQ  D:  05/18/2015  T:  05/18/2015  Job:  [id number]

## 2015-12-27 DIAGNOSIS — Z7901 Long term (current) use of anticoagulants: Secondary | ICD-10-CM | POA: Diagnosis not present

## 2015-12-27 DIAGNOSIS — D6851 Activated protein C resistance: Secondary | ICD-10-CM | POA: Diagnosis not present

## 2016-01-26 DIAGNOSIS — Z7901 Long term (current) use of anticoagulants: Secondary | ICD-10-CM | POA: Diagnosis not present

## 2016-02-29 DIAGNOSIS — Z7901 Long term (current) use of anticoagulants: Secondary | ICD-10-CM | POA: Diagnosis not present

## 2016-04-05 DIAGNOSIS — Z7901 Long term (current) use of anticoagulants: Secondary | ICD-10-CM | POA: Diagnosis not present

## 2016-05-06 DIAGNOSIS — Z7901 Long term (current) use of anticoagulants: Secondary | ICD-10-CM | POA: Diagnosis not present

## 2016-05-08 DIAGNOSIS — Z23 Encounter for immunization: Secondary | ICD-10-CM | POA: Diagnosis not present

## 2016-06-05 DIAGNOSIS — W19XXXA Unspecified fall, initial encounter: Secondary | ICD-10-CM | POA: Diagnosis not present

## 2016-06-05 DIAGNOSIS — S8000XA Contusion of unspecified knee, initial encounter: Secondary | ICD-10-CM | POA: Diagnosis not present

## 2016-06-05 DIAGNOSIS — D682 Hereditary deficiency of other clotting factors: Secondary | ICD-10-CM | POA: Diagnosis not present

## 2016-06-05 DIAGNOSIS — S46019A Strain of muscle(s) and tendon(s) of the rotator cuff of unspecified shoulder, initial encounter: Secondary | ICD-10-CM | POA: Diagnosis not present

## 2016-06-05 DIAGNOSIS — Z7901 Long term (current) use of anticoagulants: Secondary | ICD-10-CM | POA: Diagnosis not present

## 2016-06-24 DIAGNOSIS — N529 Male erectile dysfunction, unspecified: Secondary | ICD-10-CM | POA: Diagnosis not present

## 2016-06-24 DIAGNOSIS — D682 Hereditary deficiency of other clotting factors: Secondary | ICD-10-CM | POA: Diagnosis not present

## 2016-06-24 DIAGNOSIS — G4733 Obstructive sleep apnea (adult) (pediatric): Secondary | ICD-10-CM | POA: Diagnosis not present

## 2016-06-24 DIAGNOSIS — Z125 Encounter for screening for malignant neoplasm of prostate: Secondary | ICD-10-CM | POA: Diagnosis not present

## 2016-06-24 DIAGNOSIS — Z0000 Encounter for general adult medical examination without abnormal findings: Secondary | ICD-10-CM | POA: Diagnosis not present

## 2016-06-24 DIAGNOSIS — I868 Varicose veins of other specified sites: Secondary | ICD-10-CM | POA: Diagnosis not present

## 2016-07-15 DIAGNOSIS — Z7901 Long term (current) use of anticoagulants: Secondary | ICD-10-CM | POA: Diagnosis not present

## 2016-07-29 DIAGNOSIS — Z7901 Long term (current) use of anticoagulants: Secondary | ICD-10-CM | POA: Diagnosis not present

## 2016-07-29 DIAGNOSIS — D682 Hereditary deficiency of other clotting factors: Secondary | ICD-10-CM | POA: Diagnosis not present

## 2016-08-09 DIAGNOSIS — G4730 Sleep apnea, unspecified: Secondary | ICD-10-CM | POA: Diagnosis not present

## 2016-08-28 DIAGNOSIS — D682 Hereditary deficiency of other clotting factors: Secondary | ICD-10-CM | POA: Diagnosis not present

## 2016-10-04 DIAGNOSIS — D6851 Activated protein C resistance: Secondary | ICD-10-CM | POA: Diagnosis not present

## 2016-11-18 DIAGNOSIS — D6851 Activated protein C resistance: Secondary | ICD-10-CM | POA: Diagnosis not present

## 2016-12-25 DIAGNOSIS — D682 Hereditary deficiency of other clotting factors: Secondary | ICD-10-CM | POA: Diagnosis not present

## 2016-12-25 DIAGNOSIS — Z7901 Long term (current) use of anticoagulants: Secondary | ICD-10-CM | POA: Diagnosis not present

## 2017-02-05 DIAGNOSIS — Z7901 Long term (current) use of anticoagulants: Secondary | ICD-10-CM | POA: Diagnosis not present

## 2017-02-05 DIAGNOSIS — D682 Hereditary deficiency of other clotting factors: Secondary | ICD-10-CM | POA: Diagnosis not present

## 2017-02-05 DIAGNOSIS — D6851 Activated protein C resistance: Secondary | ICD-10-CM | POA: Diagnosis not present

## 2017-02-06 DIAGNOSIS — H0014 Chalazion left upper eyelid: Secondary | ICD-10-CM | POA: Diagnosis not present

## 2017-02-06 DIAGNOSIS — H2513 Age-related nuclear cataract, bilateral: Secondary | ICD-10-CM | POA: Diagnosis not present

## 2017-02-06 DIAGNOSIS — H00024 Hordeolum internum left upper eyelid: Secondary | ICD-10-CM | POA: Diagnosis not present

## 2017-02-06 DIAGNOSIS — H04123 Dry eye syndrome of bilateral lacrimal glands: Secondary | ICD-10-CM | POA: Diagnosis not present

## 2017-03-13 DIAGNOSIS — H04123 Dry eye syndrome of bilateral lacrimal glands: Secondary | ICD-10-CM | POA: Diagnosis not present

## 2017-03-13 DIAGNOSIS — H0014 Chalazion left upper eyelid: Secondary | ICD-10-CM | POA: Diagnosis not present

## 2017-03-13 DIAGNOSIS — H01021 Squamous blepharitis right upper eyelid: Secondary | ICD-10-CM | POA: Diagnosis not present

## 2017-03-13 DIAGNOSIS — H2513 Age-related nuclear cataract, bilateral: Secondary | ICD-10-CM | POA: Diagnosis not present

## 2017-03-21 DIAGNOSIS — D682 Hereditary deficiency of other clotting factors: Secondary | ICD-10-CM | POA: Diagnosis not present

## 2017-04-09 DIAGNOSIS — H01022 Squamous blepharitis right lower eyelid: Secondary | ICD-10-CM | POA: Diagnosis not present

## 2017-04-09 DIAGNOSIS — H01021 Squamous blepharitis right upper eyelid: Secondary | ICD-10-CM | POA: Diagnosis not present

## 2017-04-09 DIAGNOSIS — H01024 Squamous blepharitis left upper eyelid: Secondary | ICD-10-CM | POA: Diagnosis not present

## 2017-04-09 DIAGNOSIS — H1859 Other hereditary corneal dystrophies: Secondary | ICD-10-CM | POA: Diagnosis not present

## 2017-04-11 DIAGNOSIS — Z23 Encounter for immunization: Secondary | ICD-10-CM | POA: Diagnosis not present

## 2017-04-27 ENCOUNTER — Emergency Department (HOSPITAL_COMMUNITY): Payer: BLUE CROSS/BLUE SHIELD

## 2017-04-27 ENCOUNTER — Encounter (HOSPITAL_COMMUNITY): Payer: Self-pay

## 2017-04-27 ENCOUNTER — Emergency Department (HOSPITAL_COMMUNITY)
Admission: EM | Admit: 2017-04-27 | Discharge: 2017-04-28 | Disposition: A | Discharge status: Home / Self Care | Payer: BLUE CROSS/BLUE SHIELD | Attending: Emergency Medicine | Admitting: Emergency Medicine

## 2017-04-27 DIAGNOSIS — Z79899 Other long term (current) drug therapy: Secondary | ICD-10-CM | POA: Diagnosis not present

## 2017-04-27 DIAGNOSIS — Z7983 Long term (current) use of bisphosphonates: Secondary | ICD-10-CM | POA: Insufficient documentation

## 2017-04-27 DIAGNOSIS — F17210 Nicotine dependence, cigarettes, uncomplicated: Secondary | ICD-10-CM | POA: Insufficient documentation

## 2017-04-27 DIAGNOSIS — R109 Unspecified abdominal pain: Secondary | ICD-10-CM | POA: Diagnosis not present

## 2017-04-27 DIAGNOSIS — K5792 Diverticulitis of intestine, part unspecified, without perforation or abscess without bleeding: Secondary | ICD-10-CM | POA: Diagnosis not present

## 2017-04-27 DIAGNOSIS — K5790 Diverticulosis of intestine, part unspecified, without perforation or abscess without bleeding: Secondary | ICD-10-CM | POA: Diagnosis not present

## 2017-04-27 DIAGNOSIS — Z7901 Long term (current) use of anticoagulants: Secondary | ICD-10-CM | POA: Diagnosis not present

## 2017-04-27 DIAGNOSIS — R1032 Left lower quadrant pain: Secondary | ICD-10-CM | POA: Diagnosis present

## 2017-04-27 HISTORY — DX: Unspecified intestinal obstruction, unspecified as to partial versus complete obstruction: K56.609

## 2017-04-27 LAB — I-STAT CG4 LACTIC ACID, ED
Lactic Acid, Venous: 1.24 mmol/L (ref 0.5–1.9)
Lactic Acid, Venous: 0.7 mmol/L (ref 0.5–1.9)

## 2017-04-27 LAB — URINALYSIS, ROUTINE W REFLEX MICROSCOPIC
Bacteria, UA: NONE SEEN
Bilirubin Urine: NEGATIVE
GLUCOSE, UA: NEGATIVE mg/dL
KETONES UR: 5 mg/dL — AB
LEUKOCYTES UA: NEGATIVE
NITRITE: NEGATIVE
PROTEIN: NEGATIVE mg/dL
Specific Gravity, Urine: 1.016 (ref 1.005–1.030)
Squamous Epithelial / LPF: NONE SEEN
pH: 5 (ref 5.0–8.0)

## 2017-04-27 LAB — COMPREHENSIVE METABOLIC PANEL
ALK PHOS: 51 U/L (ref 38–126)
ALT: 26 U/L (ref 17–63)
AST: 27 U/L (ref 15–41)
Albumin: 3.8 g/dL (ref 3.5–5.0)
Anion gap: 9 (ref 5–15)
BUN: 23 mg/dL — ABNORMAL HIGH (ref 6–20)
CALCIUM: 9 mg/dL (ref 8.9–10.3)
CO2: 24 mmol/L (ref 22–32)
CREATININE: 1.24 mg/dL (ref 0.61–1.24)
Chloride: 101 mmol/L (ref 101–111)
GFR calc non Af Amer: 60 mL/min — ABNORMAL LOW (ref >60)
Glucose, Bld: 163 mg/dL — ABNORMAL HIGH (ref 65–99)
Potassium: 3.8 mmol/L (ref 3.5–5.1)
Sodium: 134 mmol/L — ABNORMAL LOW (ref 135–145)
Total Bilirubin: 1 mg/dL (ref 0.3–1.2)
Total Protein: 6.9 g/dL (ref 6.5–8.1)

## 2017-04-27 LAB — CBC
HCT: 43.5 % (ref 39.0–52.0)
Hemoglobin: 15 g/dL (ref 13.0–17.0)
MCH: 31.2 pg (ref 26.0–34.0)
MCHC: 34.5 g/dL (ref 30.0–36.0)
MCV: 90.4 fL (ref 78.0–100.0)
PLATELETS: 178 10*3/uL (ref 150–400)
RBC: 4.81 MIL/uL (ref 4.22–5.81)
RDW: 12.7 % (ref 11.5–15.5)
WBC: 12.9 10*3/uL — AB (ref 4.0–10.5)

## 2017-04-27 LAB — PROTIME-INR
INR: 1.35
Prothrombin Time: 16.5 seconds — ABNORMAL HIGH (ref 11.4–15.2)

## 2017-04-27 LAB — LIPASE, BLOOD: LIPASE: 32 U/L (ref 11–51)

## 2017-04-27 MED ORDER — SODIUM CHLORIDE 0.9 % IV BOLUS (SEPSIS)
1000.0000 mL | Freq: Once | INTRAVENOUS | Status: AC
  Administered 2017-04-27: 1000 mL via INTRAVENOUS

## 2017-04-27 MED ORDER — ONDANSETRON 4 MG PO TBDP: 4.0000 mg | ORAL_TABLET | Freq: Three times a day (TID) | ORAL | 0 refills | Status: DC | PRN

## 2017-04-27 MED ORDER — MORPHINE SULFATE 15 MG PO TABS: 15.0000 mg | ORAL_TABLET | ORAL | 0 refills | Status: DC | PRN

## 2017-04-27 MED ORDER — ACETAMINOPHEN 325 MG PO TABS
ORAL_TABLET | ORAL | Status: AC
  Filled 2017-04-27: qty 2

## 2017-04-27 MED ORDER — ACETAMINOPHEN 325 MG PO TABS
650.0000 mg | ORAL_TABLET | Freq: Once | ORAL | Status: AC | PRN
  Administered 2017-04-27: 650 mg via ORAL

## 2017-04-27 MED ORDER — MORPHINE SULFATE (PF) 4 MG/ML IV SOLN
4.0000 mg | Freq: Once | INTRAVENOUS | Status: AC
  Administered 2017-04-27: 4 mg via INTRAVENOUS
  Filled 2017-04-27: qty 1

## 2017-04-27 MED ORDER — IOPAMIDOL (ISOVUE-300) INJECTION 61%
INTRAVENOUS | Status: AC
  Administered 2017-04-27: 100 mL
  Filled 2017-04-27: qty 100

## 2017-04-27 MED ORDER — AMOXICILLIN-POT CLAVULANATE 875-125 MG PO TABS: 1.0000 | ORAL_TABLET | Freq: Two times a day (BID) | ORAL | 0 refills | Status: AC

## 2017-04-27 MED ORDER — ONDANSETRON HCL 4 MG/2ML IJ SOLN
4.0000 mg | Freq: Once | INTRAMUSCULAR | Status: AC
  Administered 2017-04-27: 4 mg via INTRAVENOUS
  Filled 2017-04-27: qty 2

## 2017-04-27 NOTE — ED Notes (Signed)
Patient transported to CT 

## 2017-04-27 NOTE — Discharge Instructions (Signed)
Take tylenol 1000mg(2 extra strength) four times a day.  ° °Then take the pain medicine if you feel like you need it. Narcotics do not help with the pain, they only make you care about it less.  You can become addicted to this, people may break into your house to steal it.  It will constipate you.  If you drive under the influence of this medicine you can get a DUI.   ° °
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
## 2017-04-27 NOTE — ED Triage Notes (Signed)
Patient complains of 2 days of LLQ abdominal pain with fever. Denies nausea, vomiting and normal bowel movement yesterday. Has hx of bowel obstruction in remote past. denies dysuria

## 2017-05-02 DIAGNOSIS — D6851 Activated protein C resistance: Secondary | ICD-10-CM | POA: Diagnosis not present

## 2017-05-02 DIAGNOSIS — K5733 Diverticulitis of large intestine without perforation or abscess with bleeding: Secondary | ICD-10-CM | POA: Diagnosis not present

## 2017-05-02 DIAGNOSIS — F17201 Nicotine dependence, unspecified, in remission: Secondary | ICD-10-CM | POA: Diagnosis not present

## 2017-05-02 DIAGNOSIS — D682 Hereditary deficiency of other clotting factors: Secondary | ICD-10-CM | POA: Diagnosis not present

## 2017-06-04 DIAGNOSIS — D682 Hereditary deficiency of other clotting factors: Secondary | ICD-10-CM | POA: Diagnosis not present

## 2017-06-04 DIAGNOSIS — D6851 Activated protein C resistance: Secondary | ICD-10-CM | POA: Diagnosis not present

## 2017-06-04 DIAGNOSIS — G4733 Obstructive sleep apnea (adult) (pediatric): Secondary | ICD-10-CM | POA: Diagnosis not present

## 2017-06-25 DIAGNOSIS — D6851 Activated protein C resistance: Secondary | ICD-10-CM | POA: Diagnosis not present

## 2017-06-25 DIAGNOSIS — Z86718 Personal history of other venous thrombosis and embolism: Secondary | ICD-10-CM | POA: Diagnosis not present

## 2017-06-25 DIAGNOSIS — N529 Male erectile dysfunction, unspecified: Secondary | ICD-10-CM | POA: Diagnosis not present

## 2017-06-25 DIAGNOSIS — E7800 Pure hypercholesterolemia, unspecified: Secondary | ICD-10-CM | POA: Diagnosis not present

## 2017-06-25 DIAGNOSIS — G4733 Obstructive sleep apnea (adult) (pediatric): Secondary | ICD-10-CM | POA: Diagnosis not present

## 2017-06-25 DIAGNOSIS — Z5181 Encounter for therapeutic drug level monitoring: Secondary | ICD-10-CM | POA: Diagnosis not present

## 2017-06-25 DIAGNOSIS — Z0000 Encounter for general adult medical examination without abnormal findings: Secondary | ICD-10-CM | POA: Diagnosis not present

## 2017-06-25 DIAGNOSIS — Z79899 Other long term (current) drug therapy: Secondary | ICD-10-CM | POA: Diagnosis not present

## 2017-07-02 DIAGNOSIS — Z7901 Long term (current) use of anticoagulants: Secondary | ICD-10-CM | POA: Diagnosis not present

## 2017-07-02 DIAGNOSIS — D6851 Activated protein C resistance: Secondary | ICD-10-CM | POA: Diagnosis not present

## 2017-08-14 DIAGNOSIS — G4733 Obstructive sleep apnea (adult) (pediatric): Secondary | ICD-10-CM | POA: Diagnosis not present

## 2017-09-05 DIAGNOSIS — Z7901 Long term (current) use of anticoagulants: Secondary | ICD-10-CM | POA: Diagnosis not present

## 2017-10-07 DIAGNOSIS — D6851 Activated protein C resistance: Secondary | ICD-10-CM | POA: Diagnosis not present

## 2017-10-07 DIAGNOSIS — D682 Hereditary deficiency of other clotting factors: Secondary | ICD-10-CM | POA: Diagnosis not present

## 2017-11-07 DIAGNOSIS — J111 Influenza due to unidentified influenza virus with other respiratory manifestations: Secondary | ICD-10-CM | POA: Diagnosis not present

## 2017-11-12 DIAGNOSIS — D682 Hereditary deficiency of other clotting factors: Secondary | ICD-10-CM | POA: Diagnosis not present

## 2017-11-12 DIAGNOSIS — D6851 Activated protein C resistance: Secondary | ICD-10-CM | POA: Diagnosis not present

## 2017-12-18 DIAGNOSIS — D682 Hereditary deficiency of other clotting factors: Secondary | ICD-10-CM | POA: Diagnosis not present

## 2018-01-08 DIAGNOSIS — S30860A Insect bite (nonvenomous) of lower back and pelvis, initial encounter: Secondary | ICD-10-CM | POA: Diagnosis not present

## 2018-01-22 DIAGNOSIS — D682 Hereditary deficiency of other clotting factors: Secondary | ICD-10-CM | POA: Diagnosis not present

## 2018-01-22 DIAGNOSIS — D6851 Activated protein C resistance: Secondary | ICD-10-CM | POA: Diagnosis not present

## 2018-02-23 DIAGNOSIS — Z7901 Long term (current) use of anticoagulants: Secondary | ICD-10-CM | POA: Diagnosis not present

## 2018-02-23 DIAGNOSIS — D682 Hereditary deficiency of other clotting factors: Secondary | ICD-10-CM | POA: Diagnosis not present

## 2018-03-11 DIAGNOSIS — H5203 Hypermetropia, bilateral: Secondary | ICD-10-CM | POA: Diagnosis not present

## 2018-04-07 DIAGNOSIS — D682 Hereditary deficiency of other clotting factors: Secondary | ICD-10-CM | POA: Diagnosis not present

## 2018-04-07 DIAGNOSIS — D6851 Activated protein C resistance: Secondary | ICD-10-CM | POA: Diagnosis not present

## 2018-05-12 DIAGNOSIS — D6851 Activated protein C resistance: Secondary | ICD-10-CM | POA: Diagnosis not present

## 2018-05-12 DIAGNOSIS — Z7901 Long term (current) use of anticoagulants: Secondary | ICD-10-CM | POA: Diagnosis not present

## 2018-05-12 DIAGNOSIS — D682 Hereditary deficiency of other clotting factors: Secondary | ICD-10-CM | POA: Diagnosis not present

## 2018-06-04 DIAGNOSIS — G4733 Obstructive sleep apnea (adult) (pediatric): Secondary | ICD-10-CM | POA: Diagnosis not present

## 2018-06-22 DIAGNOSIS — Z7901 Long term (current) use of anticoagulants: Secondary | ICD-10-CM | POA: Diagnosis not present

## 2018-06-30 DIAGNOSIS — G4733 Obstructive sleep apnea (adult) (pediatric): Secondary | ICD-10-CM | POA: Diagnosis not present

## 2018-07-04 ENCOUNTER — Ambulatory Visit (HOSPITAL_COMMUNITY)
Admission: EM | Admit: 2018-07-04 | Discharge: 2018-07-04 | Disposition: A | Discharge status: Home / Self Care | Payer: BLUE CROSS/BLUE SHIELD | Attending: Internal Medicine | Admitting: Internal Medicine

## 2018-07-04 ENCOUNTER — Ambulatory Visit (INDEPENDENT_AMBULATORY_CARE_PROVIDER_SITE_OTHER): Payer: BLUE CROSS/BLUE SHIELD

## 2018-07-04 ENCOUNTER — Encounter (HOSPITAL_COMMUNITY): Payer: Self-pay | Admitting: Emergency Medicine

## 2018-07-04 DIAGNOSIS — M25562 Pain in left knee: Secondary | ICD-10-CM

## 2018-07-04 DIAGNOSIS — S86912A Strain of unspecified muscle(s) and tendon(s) at lower leg level, left leg, initial encounter: Secondary | ICD-10-CM

## 2018-07-04 NOTE — Discharge Instructions (Addendum)
I suspect you have pain on the ligament that attaches from the Fibular to the side of the femur where the lateral collateral  ligament inserts. The bone does not look chipped or with any abnormality that I see. The final radiology reading is not back yet.   Ice this area of pain for 15- 20 minutes 2-4 times a day. Bare weight as tolerated.  Wear the knee brace for 7 days when walking or on your feet for prolonged times.  Follow up with your family Dr next week.   You may take Tylenol 500 mg 2 every 6 hours for pain as needed.   You can also try a natural anti-inflammation cream called Arnica and apply over this area.
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
## 2018-07-04 NOTE — ED Triage Notes (Signed)
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 Denies injury. States it just decided to not work anymore.

## 2018-07-26 IMAGING — CT CT ABD-PELV W/ CM
2 of 5 series · 16 of 46 positions shown, 18 images · IV contrast (APPLIED)
Comparison: 09/02/2009

CLINICAL DATA: Left-sided abdominal pain for 2 days.

EXAM:
CT ABDOMEN AND PELVIS WITH CONTRAST
TECHNIQUE: Multidetector CT imaging of the abdomen and pelvis was performed
using the standard protocol following bolus administration of
intravenous contrast.
CONTRAST:  100mL TEQGF3-8JJ IOPAMIDOL (TEQGF3-8JJ) INJECTION 61%

[Series 3: abdomen 5.0 · axial · 0.85mm/px · z∈[+921,+1356]mm · 13 of 101 slices shown, 15 images]
[im 7/101  soft-tissue]
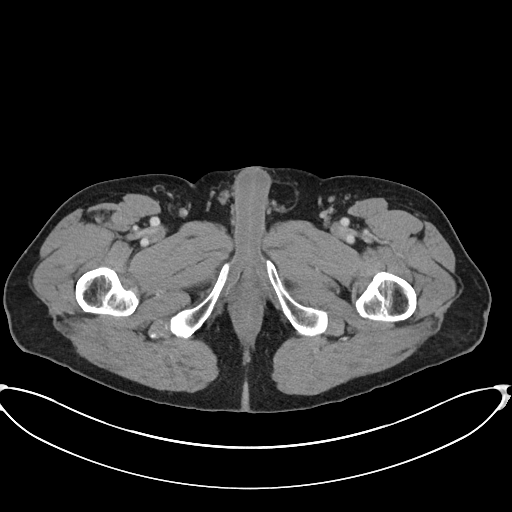
[im 7/101  bone]
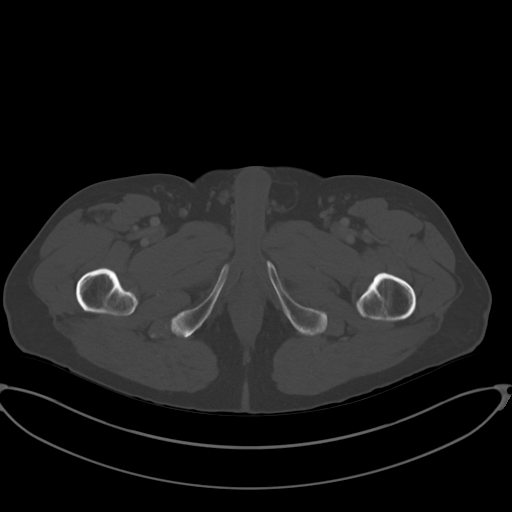
[im 13/101  soft-tissue]
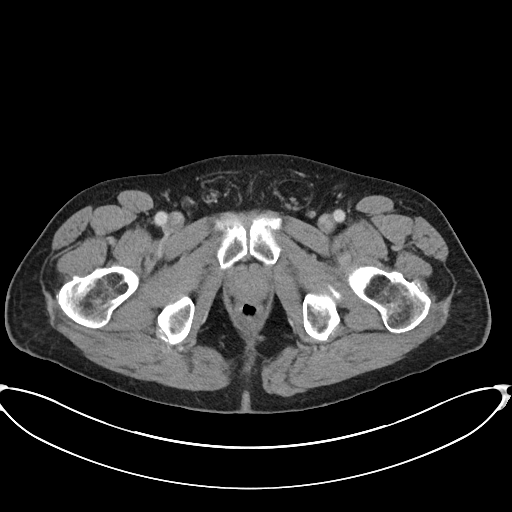
[im 19/101  soft-tissue]
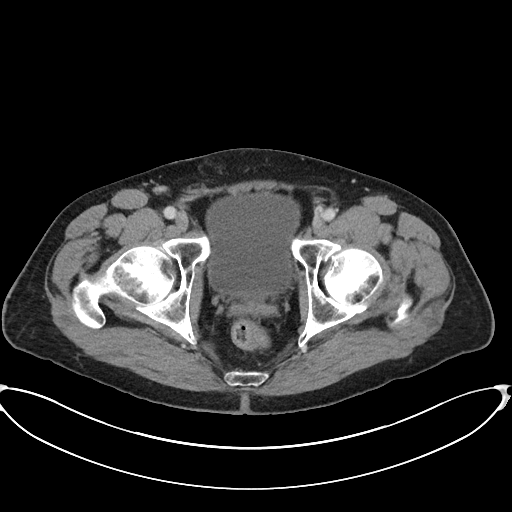
[im 32/101  soft-tissue]
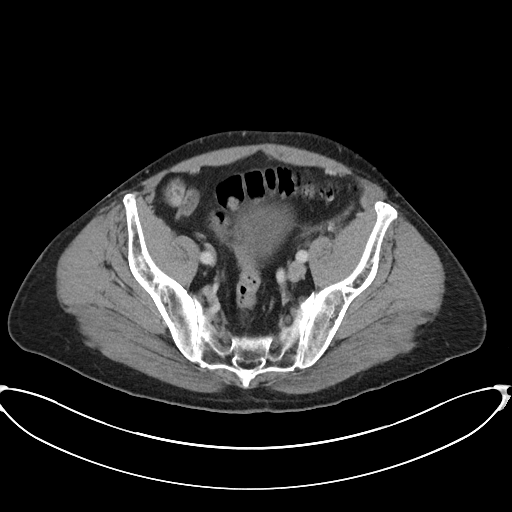
[im 38/101  soft-tissue]
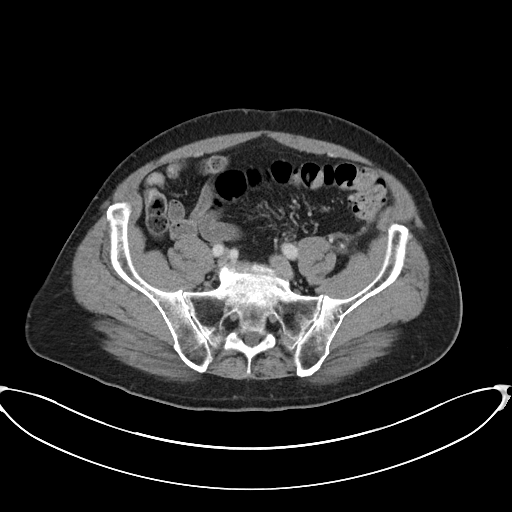
[im 44/101  soft-tissue]
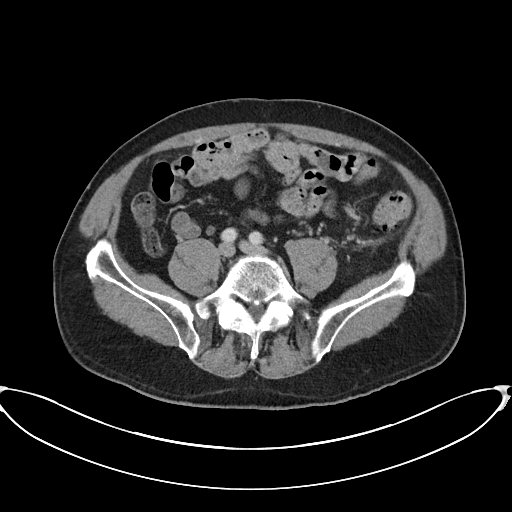
[im 51/101  soft-tissue]
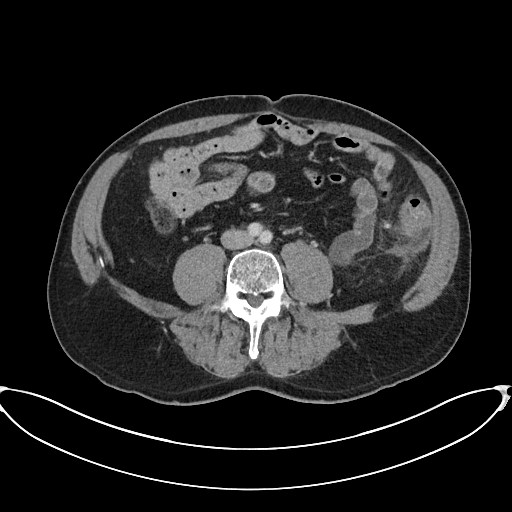
[im 57/101  soft-tissue]
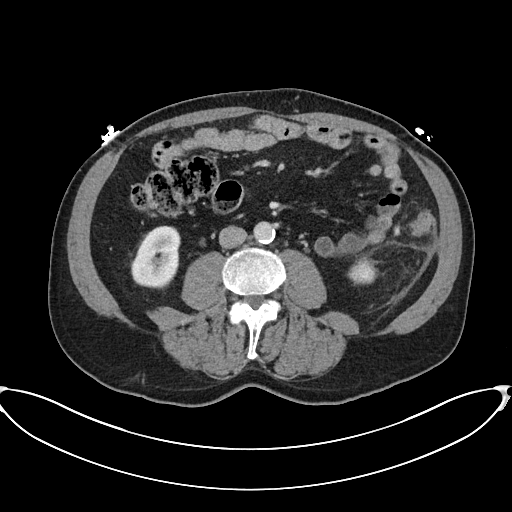
[im 63/101  soft-tissue]
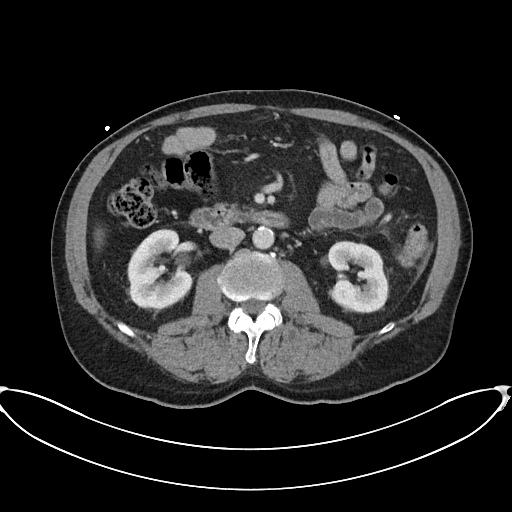
[im 63/101  bone]
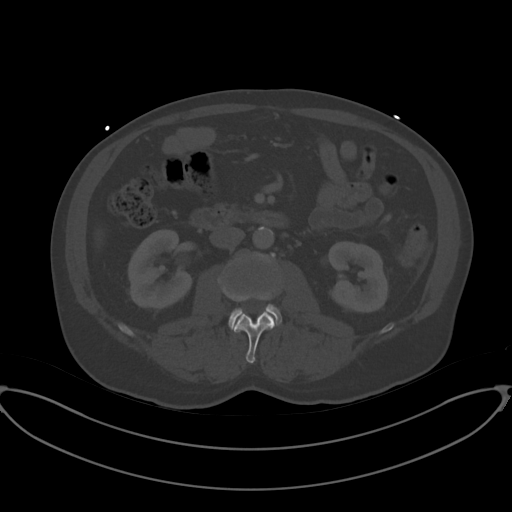
[im 69/101  soft-tissue]
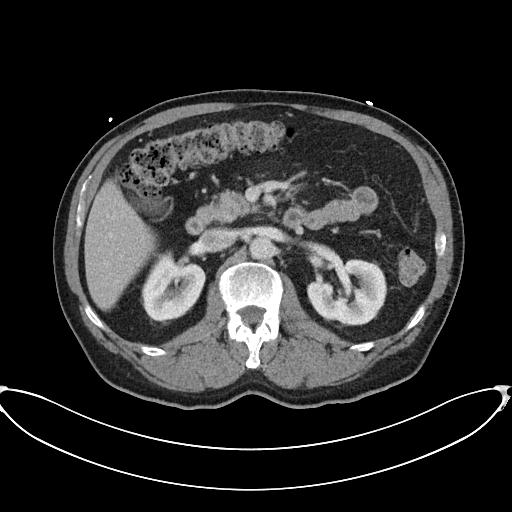
[im 82/101  soft-tissue]
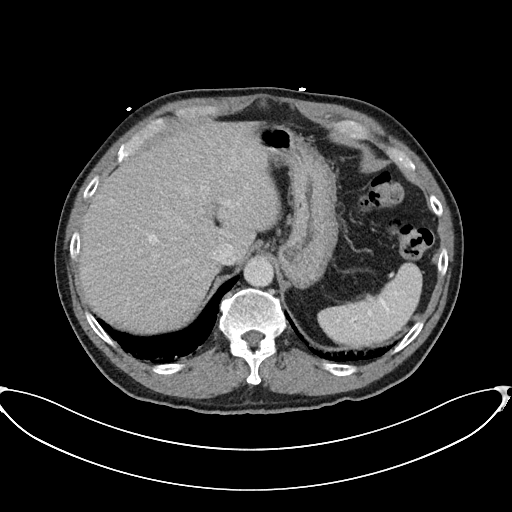
[im 88/101  soft-tissue]
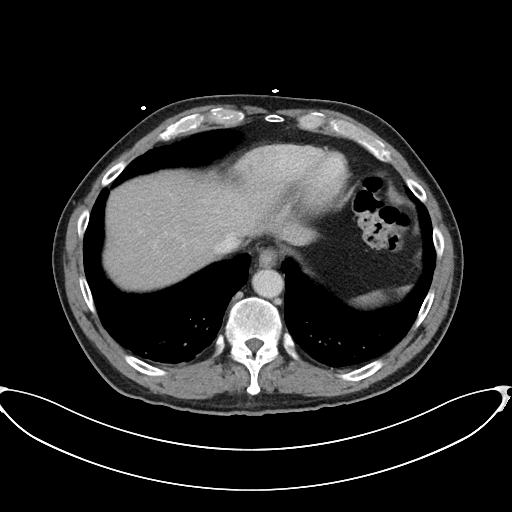
[im 94/101  soft-tissue]
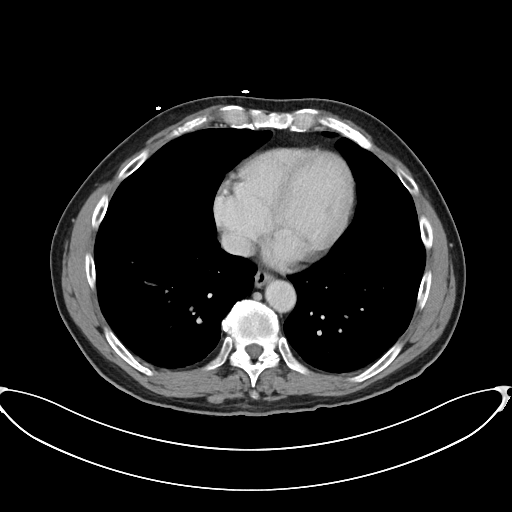

[Series 6: abdomen 3.0 mpr cor · coronal · 0.77mm/px · 3 of 99 slices shown]
[im 33/99  soft-tissue]
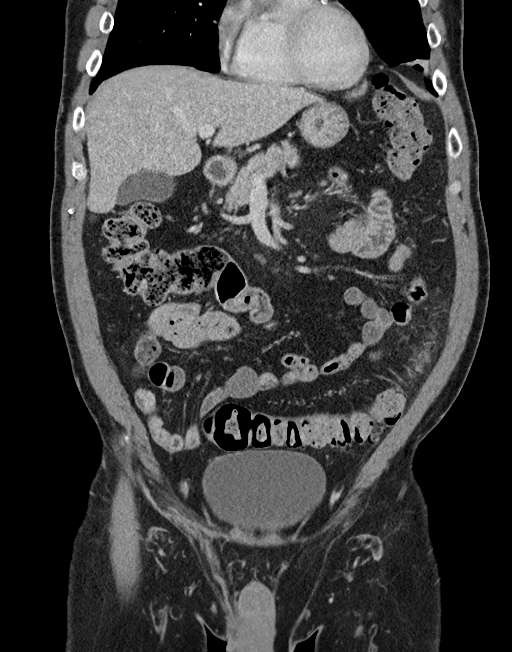
[im 44/99  soft-tissue]
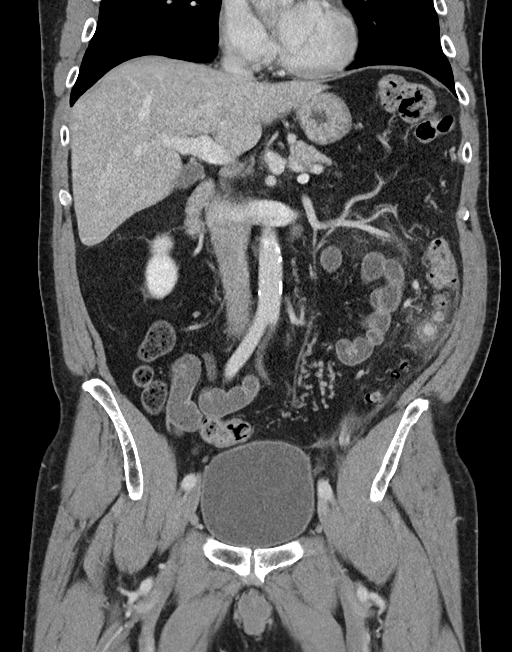
[im 55/99  soft-tissue]
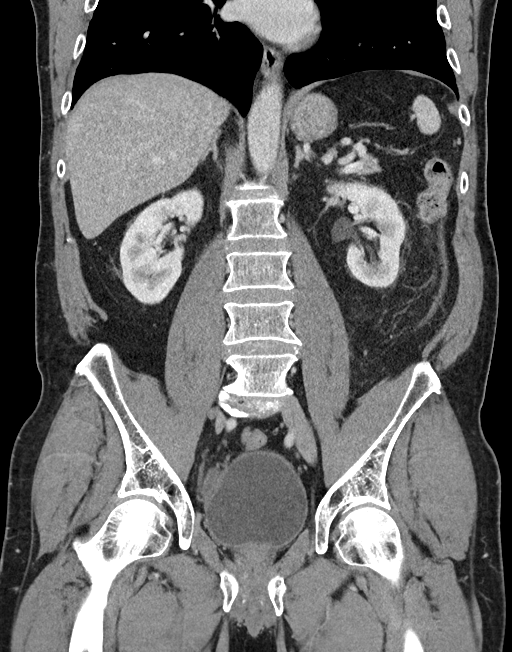

[16 of 46 positions shown; findings below may reference images not displayed]

FINDINGS: Lower chest: No acute abnormality.

Hepatobiliary: No focal liver abnormality is seen. No gallstones,
gallbladder wall thickening, or biliary dilatation.

Pancreas: Unremarkable. No pancreatic ductal dilatation or
surrounding inflammatory changes.

Spleen: Normal in size without focal abnormality.

Adrenals/Urinary Tract: Adrenal glands are unremarkable. Kidneys are
normal, without renal calculi, focal lesion, or hydronephrosis.
Bladder is notable for a moderate right-sided diverticulum.

Stomach/Bowel: Acute inflammation centered on a diverticulum of the
mid descending colon, typical of acute diverticulitis. No abscess.
No bowel obstruction. No free intraperitoneal air.

Stomach and small bowel are unremarkable.

Vascular/Lymphatic: The abdominal aorta is normal in caliber with
moderate atherosclerotic calcification. No adenopathy or ascites.

Reproductive: Unremarkable

Other: Small fat containing left inguinal hernia.

No ascites.

Musculoskeletal: No significant skeletal lesion.
IMPRESSION: 1. Acute diverticulitis, mid descending colon.  No abscess.
2. Right-sided urinary bladder diverticulum, unchanged.
3. Aortic atherosclerosis.
4. Small fat containing left inguinal hernia.

## 2018-07-27 DIAGNOSIS — D6851 Activated protein C resistance: Secondary | ICD-10-CM | POA: Diagnosis not present

## 2018-07-27 DIAGNOSIS — D682 Hereditary deficiency of other clotting factors: Secondary | ICD-10-CM | POA: Diagnosis not present

## 2018-07-29 DIAGNOSIS — N529 Male erectile dysfunction, unspecified: Secondary | ICD-10-CM | POA: Diagnosis not present

## 2018-07-29 DIAGNOSIS — R3129 Other microscopic hematuria: Secondary | ICD-10-CM | POA: Diagnosis not present

## 2018-07-29 DIAGNOSIS — Z23 Encounter for immunization: Secondary | ICD-10-CM | POA: Diagnosis not present

## 2018-07-29 DIAGNOSIS — E7800 Pure hypercholesterolemia, unspecified: Secondary | ICD-10-CM | POA: Diagnosis not present

## 2018-07-29 DIAGNOSIS — Z0000 Encounter for general adult medical examination without abnormal findings: Secondary | ICD-10-CM | POA: Diagnosis not present

## 2018-07-29 DIAGNOSIS — G4733 Obstructive sleep apnea (adult) (pediatric): Secondary | ICD-10-CM | POA: Diagnosis not present

## 2018-07-29 DIAGNOSIS — Z79899 Other long term (current) drug therapy: Secondary | ICD-10-CM | POA: Diagnosis not present

## 2018-07-29 DIAGNOSIS — D682 Hereditary deficiency of other clotting factors: Secondary | ICD-10-CM | POA: Diagnosis not present

## 2018-08-13 ENCOUNTER — Other Ambulatory Visit: Payer: Self-pay | Admitting: Internal Medicine

## 2018-08-13 DIAGNOSIS — R3129 Other microscopic hematuria: Secondary | ICD-10-CM

## 2018-08-13 DIAGNOSIS — R195 Other fecal abnormalities: Secondary | ICD-10-CM | POA: Diagnosis not present

## 2018-08-13 DIAGNOSIS — D6851 Activated protein C resistance: Secondary | ICD-10-CM | POA: Diagnosis not present

## 2018-08-17 ENCOUNTER — Ambulatory Visit
Admission: RE | Admit: 2018-08-17 | Discharge: 2018-08-17 | Disposition: A | Discharge status: Home / Self Care | Payer: BLUE CROSS/BLUE SHIELD | Source: Ambulatory Visit | Attending: Internal Medicine | Admitting: Internal Medicine

## 2018-08-17 DIAGNOSIS — R3129 Other microscopic hematuria: Secondary | ICD-10-CM

## 2018-08-25 ENCOUNTER — Other Ambulatory Visit: Payer: Self-pay | Admitting: Internal Medicine

## 2018-08-25 DIAGNOSIS — R3129 Other microscopic hematuria: Secondary | ICD-10-CM

## 2018-09-02 DIAGNOSIS — D6851 Activated protein C resistance: Secondary | ICD-10-CM | POA: Diagnosis not present

## 2018-09-02 DIAGNOSIS — D682 Hereditary deficiency of other clotting factors: Secondary | ICD-10-CM | POA: Diagnosis not present

## 2018-09-22 DIAGNOSIS — J019 Acute sinusitis, unspecified: Secondary | ICD-10-CM | POA: Diagnosis not present

## 2018-09-28 DIAGNOSIS — D682 Hereditary deficiency of other clotting factors: Secondary | ICD-10-CM | POA: Diagnosis not present

## 2018-10-02 DIAGNOSIS — D128 Benign neoplasm of rectum: Secondary | ICD-10-CM | POA: Diagnosis not present

## 2018-10-02 DIAGNOSIS — K5730 Diverticulosis of large intestine without perforation or abscess without bleeding: Secondary | ICD-10-CM | POA: Diagnosis not present

## 2018-10-02 DIAGNOSIS — D123 Benign neoplasm of transverse colon: Secondary | ICD-10-CM | POA: Diagnosis not present

## 2018-10-02 DIAGNOSIS — Z980 Intestinal bypass and anastomosis status: Secondary | ICD-10-CM | POA: Diagnosis not present

## 2018-10-02 DIAGNOSIS — D125 Benign neoplasm of sigmoid colon: Secondary | ICD-10-CM | POA: Diagnosis not present

## 2018-10-02 DIAGNOSIS — Z86010 Personal history of colonic polyps: Secondary | ICD-10-CM | POA: Diagnosis not present

## 2018-10-02 DIAGNOSIS — K648 Other hemorrhoids: Secondary | ICD-10-CM | POA: Diagnosis not present

## 2018-10-06 DIAGNOSIS — D125 Benign neoplasm of sigmoid colon: Secondary | ICD-10-CM | POA: Diagnosis not present

## 2018-10-06 DIAGNOSIS — D128 Benign neoplasm of rectum: Secondary | ICD-10-CM | POA: Diagnosis not present

## 2018-10-06 DIAGNOSIS — D123 Benign neoplasm of transverse colon: Secondary | ICD-10-CM | POA: Diagnosis not present

## 2018-10-09 DIAGNOSIS — G4733 Obstructive sleep apnea (adult) (pediatric): Secondary | ICD-10-CM | POA: Diagnosis not present

## 2018-10-29 DIAGNOSIS — D682 Hereditary deficiency of other clotting factors: Secondary | ICD-10-CM | POA: Diagnosis not present

## 2018-11-27 DIAGNOSIS — D682 Hereditary deficiency of other clotting factors: Secondary | ICD-10-CM | POA: Diagnosis not present

## 2018-11-27 DIAGNOSIS — D6851 Activated protein C resistance: Secondary | ICD-10-CM | POA: Diagnosis not present

## 2019-01-01 DIAGNOSIS — D682 Hereditary deficiency of other clotting factors: Secondary | ICD-10-CM | POA: Diagnosis not present

## 2019-01-08 DIAGNOSIS — G4733 Obstructive sleep apnea (adult) (pediatric): Secondary | ICD-10-CM | POA: Diagnosis not present

## 2019-02-01 DIAGNOSIS — D682 Hereditary deficiency of other clotting factors: Secondary | ICD-10-CM | POA: Diagnosis not present

## 2019-03-10 DIAGNOSIS — Z7901 Long term (current) use of anticoagulants: Secondary | ICD-10-CM | POA: Diagnosis not present

## 2019-04-07 DIAGNOSIS — D6851 Activated protein C resistance: Secondary | ICD-10-CM | POA: Diagnosis not present

## 2019-04-07 DIAGNOSIS — I868 Varicose veins of other specified sites: Secondary | ICD-10-CM | POA: Diagnosis not present

## 2019-04-07 DIAGNOSIS — D682 Hereditary deficiency of other clotting factors: Secondary | ICD-10-CM | POA: Diagnosis not present

## 2019-04-07 DIAGNOSIS — K432 Incisional hernia without obstruction or gangrene: Secondary | ICD-10-CM | POA: Diagnosis not present

## 2019-04-17 DIAGNOSIS — R21 Rash and other nonspecific skin eruption: Secondary | ICD-10-CM | POA: Diagnosis not present

## 2019-04-19 DIAGNOSIS — I83813 Varicose veins of bilateral lower extremities with pain: Secondary | ICD-10-CM | POA: Diagnosis not present

## 2019-04-19 DIAGNOSIS — I8312 Varicose veins of left lower extremity with inflammation: Secondary | ICD-10-CM | POA: Diagnosis not present

## 2019-04-19 DIAGNOSIS — I8311 Varicose veins of right lower extremity with inflammation: Secondary | ICD-10-CM | POA: Diagnosis not present

## 2019-04-22 DIAGNOSIS — H5203 Hypermetropia, bilateral: Secondary | ICD-10-CM | POA: Diagnosis not present

## 2019-04-29 ENCOUNTER — Ambulatory Visit: Payer: Self-pay | Admitting: Surgery

## 2019-04-29 DIAGNOSIS — Z7901 Long term (current) use of anticoagulants: Secondary | ICD-10-CM | POA: Diagnosis not present

## 2019-04-29 DIAGNOSIS — K430 Incisional hernia with obstruction, without gangrene: Secondary | ICD-10-CM | POA: Diagnosis not present

## 2019-04-29 DIAGNOSIS — D6851 Activated protein C resistance: Secondary | ICD-10-CM | POA: Diagnosis not present

## 2019-05-11 DIAGNOSIS — I83813 Varicose veins of bilateral lower extremities with pain: Secondary | ICD-10-CM | POA: Diagnosis not present

## 2019-05-11 DIAGNOSIS — I8311 Varicose veins of right lower extremity with inflammation: Secondary | ICD-10-CM | POA: Diagnosis not present

## 2019-05-11 DIAGNOSIS — I87022 Postthrombotic syndrome with inflammation of left lower extremity: Secondary | ICD-10-CM | POA: Diagnosis not present

## 2019-05-11 DIAGNOSIS — I8312 Varicose veins of left lower extremity with inflammation: Secondary | ICD-10-CM | POA: Diagnosis not present

## 2019-05-13 DIAGNOSIS — D682 Hereditary deficiency of other clotting factors: Secondary | ICD-10-CM | POA: Diagnosis not present

## 2019-05-13 DIAGNOSIS — K432 Incisional hernia without obstruction or gangrene: Secondary | ICD-10-CM | POA: Diagnosis not present

## 2019-05-24 NOTE — Progress Notes (Signed)
PCP - Josetta Huddle Cardiologist -   Chest x-ray -  EKG -  Stress Test -  ECHO -  Cardiac Cath -   Sleep Study -  CPAP - YES  Fasting Blood Sugar -  Checks Blood Sugar _____ times a day  Blood Thinner Instructions:  Coumadin Lovenox [place] Aspirin Instructions: Last Dose:LAST DOSE COUMADIN 05-20-19 LOVENOX STARTED 05-21-19 LAST DOSE 10-08-26-18  Anesthesia review: Blood thinner, Factor V  Patient denies shortness of breath, fever, cough and chest pain at PAT appointment  NONE   Patient verbalized understanding of instructions that were given to them at the PAT appointment. Patient was also instructed that they will need to review over the PAT instructions again at home before surgery.

## 2019-05-24 NOTE — Patient Instructions (Signed)
DUE TO COVID-19 ONLY ONE VISITOR IS ALLOWED TO COME WITH YOU AND STAY IN THE WAITING ROOM ONLY DURING PRE OP AND PROCEDURE DAY OF SURGERY. THE 1 VISITOR MAY VISIT WITH YOU AFTER SURGERY IN YOUR PRIVATE ROOM DURING VISITING HOURS ONLY!  YOU NEED TO HAVE A COVID 19 TEST ON_______ @_______ , THIS TEST MUST BE DONE BEFORE SURGERY, COME  [place], [address] [place] , [address].  ([place]) ONCE YOUR COVID TEST IS COMPLETED, PLEASE BEGIN THE QUARANTINE INSTRUCTIONS AS OUTLINED IN YOUR HANDOUT.                HODGE STACHNIK  05/24/2019   Your procedure is scheduled on: 05-28-19   Report to [facility] Main  Entrance   Report to  Short stay  at       0530 AM     Call this number if you have problems the morning of surgery [phone]    Remember: Do not eat food or drink liquids :After Midnight.   BRUSH YOUR TEETH MORNING OF SURGERY AND RINSE YOUR MOUTH OUT, NO CHEWING GUM CANDY OR MINTS.     Take these medicines the morning of surgery with A SIP OF WATER: none                                 You may not have any metal on your body including hair pins and              piercings  Do not wear jewelry,  lotions, powders or perfumes, deodorant                      Men may shave face and neck.   Do not bring valuables to the hospital. [place].  Contacts, dentures or bridgework may not be worn into surgery.                 Please read over the following fact sheets you were given: _____________________________________________________________________            [facility] - Preparing for Surgery Before surgery, you can play an important role.  Because skin is not sterile, your skin needs to be as free of germs as possible.  You can reduce the number of germs on your skin by washing with CHG (chlorahexidine gluconate) soap before surgery.  CHG is an antiseptic cleaner which kills germs and bonds with the skin to  continue killing germs even after washing. Please DO NOT use if you have an allergy to CHG or antibacterial soaps.  If your skin becomes reddened/irritated stop using the CHG and inform your nurse when you arrive at Short Stay. Do not shave (including legs and underarms) for at least 48 hours prior to the first CHG shower.  You may shave your face/neck. Please follow these instructions carefully:  1.  Shower with CHG Soap the night before surgery and the  morning of Surgery.  2.  If you choose to wash your hair, wash your hair first as usual with your  normal  shampoo.  3.  After you shampoo, rinse your hair and body thoroughly to remove the  shampoo.  4.  Use CHG as you would any other liquid soap.  You can apply chg directly  to the skin and wash                       Gently with a scrungie or clean washcloth.  5.  Apply the CHG Soap to your body ONLY FROM THE NECK DOWN.   Do not use on face/ open                           Wound or open sores. Avoid contact with eyes, ears mouth and genitals (private parts).                       Wash face,  Genitals (private parts) with your normal soap.             6.  Wash thoroughly, paying special attention to the area where your surgery  will be performed.  7.  Thoroughly rinse your body with warm water from the neck down.  8.  DO NOT shower/wash with your normal soap after using and rinsing off  the CHG Soap.                9.  Pat yourself dry with a clean towel.            10.  Wear clean pajamas.            11.  Place clean sheets on your bed the night of your first shower and do not  sleep with pets. Day of Surgery : Do not apply any lotions/deodorants the morning of surgery.  Please wear clean clothes to the hospital/surgery center.  FAILURE TO FOLLOW THESE INSTRUCTIONS MAY RESULT IN THE CANCELLATION OF YOUR SURGERY PATIENT SIGNATURE_________________________________  NURSE  SIGNATURE__________________________________  ________________________________________________________________________

## 2019-05-25 ENCOUNTER — Encounter (HOSPITAL_COMMUNITY): Payer: Self-pay

## 2019-05-25 ENCOUNTER — Other Ambulatory Visit (HOSPITAL_COMMUNITY)
Admission: RE | Admit: 2019-05-25 | Discharge: 2019-05-25 | Disposition: A | Discharge status: Home / Self Care | Payer: BC Managed Care – PPO | Source: Ambulatory Visit | Attending: Surgery | Admitting: Surgery

## 2019-05-25 ENCOUNTER — Encounter (HOSPITAL_COMMUNITY)
Admission: RE | Admit: 2019-05-25 | Discharge: 2019-05-25 | Disposition: A | Discharge status: Home / Self Care | Payer: BC Managed Care – PPO | Source: Ambulatory Visit | Attending: Surgery | Admitting: Surgery

## 2019-05-25 ENCOUNTER — Other Ambulatory Visit: Payer: Self-pay

## 2019-05-25 DIAGNOSIS — Z20828 Contact with and (suspected) exposure to other viral communicable diseases: Secondary | ICD-10-CM | POA: Insufficient documentation

## 2019-05-25 DIAGNOSIS — Z01812 Encounter for preprocedural laboratory examination: Secondary | ICD-10-CM | POA: Insufficient documentation

## 2019-05-25 DIAGNOSIS — K430 Incisional hernia with obstruction, without gangrene: Secondary | ICD-10-CM | POA: Insufficient documentation

## 2019-05-25 HISTORY — DX: Sleep apnea, unspecified: G47.30

## 2019-05-25 LAB — CBC
HCT: 45.7 % (ref 39.0–52.0)
Hemoglobin: 15.4 g/dL (ref 13.0–17.0)
MCH: 31.2 pg (ref 26.0–34.0)
MCHC: 33.7 g/dL (ref 30.0–36.0)
MCV: 92.5 fL (ref 80.0–100.0)
Platelets: 170 10*3/uL (ref 150–400)
RBC: 4.94 MIL/uL (ref 4.22–5.81)
RDW: 12 % (ref 11.5–15.5)
WBC: 4.8 10*3/uL (ref 4.0–10.5)
nRBC: 0 % (ref 0.0–0.2)

## 2019-05-25 LAB — PROTIME-INR
INR: 1.6 — ABNORMAL HIGH (ref 0.8–1.2)
Prothrombin Time: 19.2 seconds — ABNORMAL HIGH (ref 11.4–15.2)

## 2019-05-26 ENCOUNTER — Encounter (HOSPITAL_COMMUNITY): Payer: Self-pay | Admitting: Surgery

## 2019-05-26 DIAGNOSIS — K432 Incisional hernia without obstruction or gangrene: Secondary | ICD-10-CM | POA: Diagnosis present

## 2019-05-26 DIAGNOSIS — D6851 Activated protein C resistance: Secondary | ICD-10-CM | POA: Diagnosis present

## 2019-05-26 DIAGNOSIS — Z7901 Long term (current) use of anticoagulants: Secondary | ICD-10-CM

## 2019-05-26 LAB — NOVEL CORONAVIRUS, NAA (HOSP ORDER, SEND-OUT TO REF LAB; TAT 18-24 HRS): SARS-CoV-2, NAA: NOT DETECTED
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
## 2019-05-28 ENCOUNTER — Ambulatory Visit (HOSPITAL_COMMUNITY): Payer: BC Managed Care – PPO | Admitting: Physician Assistant

## 2019-05-28 ENCOUNTER — Ambulatory Visit (HOSPITAL_COMMUNITY): Payer: BC Managed Care – PPO | Admitting: Certified Registered"

## 2019-05-28 ENCOUNTER — Encounter (HOSPITAL_COMMUNITY): Payer: Self-pay

## 2019-05-28 ENCOUNTER — Ambulatory Visit (HOSPITAL_COMMUNITY)
Admission: RE | Admit: 2019-05-28 | Discharge: 2019-05-28 | Disposition: A | Discharge status: Home / Self Care | Payer: BC Managed Care – PPO | Attending: Surgery | Admitting: Surgery

## 2019-05-28 ENCOUNTER — Encounter (HOSPITAL_COMMUNITY)
Admission: RE | Disposition: A | Discharge status: Home / Self Care | Payer: Self-pay | Source: Home / Self Care | Attending: Surgery

## 2019-05-28 DIAGNOSIS — Z7901 Long term (current) use of anticoagulants: Secondary | ICD-10-CM | POA: Insufficient documentation

## 2019-05-28 DIAGNOSIS — K430 Incisional hernia with obstruction, without gangrene: Secondary | ICD-10-CM | POA: Insufficient documentation

## 2019-05-28 DIAGNOSIS — K432 Incisional hernia without obstruction or gangrene: Secondary | ICD-10-CM | POA: Diagnosis present

## 2019-05-28 DIAGNOSIS — Z79899 Other long term (current) drug therapy: Secondary | ICD-10-CM | POA: Diagnosis not present

## 2019-05-28 DIAGNOSIS — I82409 Acute embolism and thrombosis of unspecified deep veins of unspecified lower extremity: Secondary | ICD-10-CM | POA: Diagnosis not present

## 2019-05-28 DIAGNOSIS — G4730 Sleep apnea, unspecified: Secondary | ICD-10-CM | POA: Insufficient documentation

## 2019-05-28 DIAGNOSIS — Z86718 Personal history of other venous thrombosis and embolism: Secondary | ICD-10-CM | POA: Insufficient documentation

## 2019-05-28 DIAGNOSIS — D6851 Activated protein C resistance: Secondary | ICD-10-CM | POA: Diagnosis present

## 2019-05-28 DIAGNOSIS — Z87891 Personal history of nicotine dependence: Secondary | ICD-10-CM | POA: Diagnosis not present

## 2019-05-28 HISTORY — PX: INCISIONAL HERNIA REPAIR: SHX193

## 2019-05-28 LAB — PROTIME-INR
INR: 1 (ref 0.8–1.2)
Prothrombin Time: 12.8 seconds (ref 11.4–15.2)

## 2019-05-28 SURGERY — REPAIR, HERNIA, INCISIONAL
Anesthesia: General | Site: Abdomen

## 2019-05-28 MED ORDER — PROPOFOL 10 MG/ML IV BOLUS
INTRAVENOUS | Status: DC | PRN
  Administered 2019-05-28: 170 mg via INTRAVENOUS

## 2019-05-28 MED ORDER — FENTANYL CITRATE (PF) 100 MCG/2ML IJ SOLN
INTRAMUSCULAR | Status: AC
  Filled 2019-05-28: qty 2

## 2019-05-28 MED ORDER — ROCURONIUM BROMIDE 10 MG/ML (PF) SYRINGE
PREFILLED_SYRINGE | INTRAVENOUS | Status: AC
  Filled 2019-05-28: qty 10

## 2019-05-28 MED ORDER — FENTANYL CITRATE (PF) 100 MCG/2ML IJ SOLN: 25.0000 ug | INTRAMUSCULAR | Status: DC | PRN

## 2019-05-28 MED ORDER — FENTANYL CITRATE (PF) 250 MCG/5ML IJ SOLN
INTRAMUSCULAR | Status: DC | PRN
  Administered 2019-05-28: 50 ug via INTRAVENOUS
  Administered 2019-05-28: 100 ug via INTRAVENOUS

## 2019-05-28 MED ORDER — BUPIVACAINE HCL (PF) 0.25 % IJ SOLN
INTRAMUSCULAR | Status: AC
  Filled 2019-05-28: qty 30

## 2019-05-28 MED ORDER — CHLORHEXIDINE GLUCONATE CLOTH 2 % EX PADS: 6.0000 | MEDICATED_PAD | Freq: Once | CUTANEOUS | Status: DC

## 2019-05-28 MED ORDER — 0.9 % SODIUM CHLORIDE (POUR BTL) OPTIME
TOPICAL | Status: DC | PRN
  Administered 2019-05-28: 1000 mL

## 2019-05-28 MED ORDER — DEXAMETHASONE SODIUM PHOSPHATE 10 MG/ML IJ SOLN
INTRAMUSCULAR | Status: DC | PRN
  Administered 2019-05-28: 8 mg via INTRAVENOUS

## 2019-05-28 MED ORDER — BUPIVACAINE-EPINEPHRINE (PF) 0.25% -1:200000 IJ SOLN
INTRAMUSCULAR | Status: DC | PRN
  Administered 2019-05-28: 30 mL

## 2019-05-28 MED ORDER — ONDANSETRON HCL 4 MG/2ML IJ SOLN
INTRAMUSCULAR | Status: DC | PRN
  Administered 2019-05-28: 4 mg via INTRAVENOUS

## 2019-05-28 MED ORDER — LIDOCAINE 2% (20 MG/ML) 5 ML SYRINGE
INTRAMUSCULAR | Status: AC
  Filled 2019-05-28: qty 5

## 2019-05-28 MED ORDER — METOCLOPRAMIDE HCL 5 MG/ML IJ SOLN: 10.0000 mg | Freq: Once | INTRAMUSCULAR | Status: DC | PRN

## 2019-05-28 MED ORDER — SODIUM CHLORIDE 0.9 % IV SOLN
1.0000 g | INTRAVENOUS | Status: AC
  Administered 2019-05-28: 1 g via INTRAVENOUS
  Filled 2019-05-28: qty 1

## 2019-05-28 MED ORDER — ROCURONIUM BROMIDE 10 MG/ML (PF) SYRINGE
PREFILLED_SYRINGE | INTRAVENOUS | Status: DC | PRN
  Administered 2019-05-28: 50 mg via INTRAVENOUS

## 2019-05-28 MED ORDER — MIDAZOLAM HCL 2 MG/2ML IJ SOLN
INTRAMUSCULAR | Status: DC | PRN
  Administered 2019-05-28: 2 mg via INTRAVENOUS

## 2019-05-28 MED ORDER — LIDOCAINE 2% (20 MG/ML) 5 ML SYRINGE
INTRAMUSCULAR | Status: DC | PRN
  Administered 2019-05-28: 40 mg via INTRAVENOUS

## 2019-05-28 MED ORDER — EPHEDRINE SULFATE-NACL 50-0.9 MG/10ML-% IV SOSY
PREFILLED_SYRINGE | INTRAVENOUS | Status: DC | PRN
  Administered 2019-05-28: 5 mg via INTRAVENOUS

## 2019-05-28 MED ORDER — LACTATED RINGERS IV SOLN
INTRAVENOUS | Status: DC
  Administered 2019-05-28: 06:00:00 via INTRAVENOUS

## 2019-05-28 MED ORDER — DEXAMETHASONE SODIUM PHOSPHATE 10 MG/ML IJ SOLN
INTRAMUSCULAR | Status: AC
  Filled 2019-05-28: qty 1

## 2019-05-28 MED ORDER — MIDAZOLAM HCL 2 MG/2ML IJ SOLN
INTRAMUSCULAR | Status: AC
  Filled 2019-05-28: qty 2

## 2019-05-28 MED ORDER — TRAMADOL HCL 50 MG PO TABS: 50.0000 mg | ORAL_TABLET | Freq: Four times a day (QID) | ORAL | 0 refills | Status: AC | PRN

## 2019-05-28 MED ORDER — PROPOFOL 10 MG/ML IV BOLUS
INTRAVENOUS | Status: AC
  Filled 2019-05-28: qty 20

## 2019-05-28 MED ORDER — ONDANSETRON HCL 4 MG/2ML IJ SOLN
INTRAMUSCULAR | Status: AC
  Filled 2019-05-28: qty 2

## 2019-05-28 MED ORDER — SUGAMMADEX SODIUM 200 MG/2ML IV SOLN
INTRAVENOUS | Status: DC | PRN
  Administered 2019-05-28: 170 mg via INTRAVENOUS

## 2019-05-28 MED ORDER — MEPERIDINE HCL 50 MG/ML IJ SOLN: 6.2500 mg | INTRAMUSCULAR | Status: DC | PRN

## 2019-05-28 SURGICAL SUPPLY — 27 items
ADH SKN CLS APL DERMABOND .7 (GAUZE/BANDAGES/DRESSINGS) ×1
APL PRP STRL LF DISP 70% ISPRP (MISCELLANEOUS) ×1
BINDER ABDOMINAL 12 ML 46-62 (SOFTGOODS) ×1 IMPLANT
CHLORAPREP W/TINT 26 (MISCELLANEOUS) ×2 IMPLANT
COVER SURGICAL LIGHT HANDLE (MISCELLANEOUS) ×2 IMPLANT
COVER WAND RF STERILE (DRAPES) IMPLANT
DERMABOND ADVANCED (GAUZE/BANDAGES/DRESSINGS) ×1
DERMABOND ADVANCED .7 DNX12 (GAUZE/BANDAGES/DRESSINGS) IMPLANT
DRAPE LAPAROSCOPIC ABDOMINAL (DRAPES) ×2 IMPLANT
ELECT REM PT RETURN 15FT ADLT (MISCELLANEOUS) ×2 IMPLANT
GAUZE SPONGE 4X4 12PLY STRL (GAUZE/BANDAGES/DRESSINGS) ×2 IMPLANT
GLOVE SURG ORTHO 8.0 STRL STRW (GLOVE) ×2 IMPLANT
GOWN STRL REUS W/TWL LRG LVL3 (GOWN DISPOSABLE) ×2 IMPLANT
GOWN STRL REUS W/TWL XL LVL3 (GOWN DISPOSABLE) ×4 IMPLANT
KIT BASIN OR (CUSTOM PROCEDURE TRAY) ×2 IMPLANT
KIT TURNOVER KIT A (KITS) IMPLANT
MESH VENTRALEX ST 1-7/10 CRC S (Mesh General) ×1 IMPLANT
NS IRRIG 1000ML POUR BTL (IV SOLUTION) ×2 IMPLANT
PACK GENERAL/GYN (CUSTOM PROCEDURE TRAY) ×2 IMPLANT
STAPLER VISISTAT 35W (STAPLE) ×2 IMPLANT
SUT NOVA 0 T19/GS 22DT (SUTURE) ×1 IMPLANT
SUT NOVA 1 T20/GS 25DT (SUTURE) IMPLANT
SUT NOVA NAB GS-21 0 18 T12 DT (SUTURE) IMPLANT
SUT VIC AB 2-0 CT2 27 (SUTURE) ×2 IMPLANT
SUT VIC AB 3-0 SH 18 (SUTURE) ×1 IMPLANT
TOWEL OR 17X26 10 PK STRL BLUE (TOWEL DISPOSABLE) ×2 IMPLANT
TRAY FOLEY MTR SLVR 16FR STAT (SET/KITS/TRAYS/PACK) ×2 IMPLANT
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
## 2019-05-28 NOTE — Anesthesia Postprocedure Evaluation (Signed)
Anesthesia Post Note  Patient: Shawn Farley  Procedure(s) Performed: OPEN REPAIR INCISIONAL HERNIA With MESH (N/A Abdomen)     Patient location during evaluation: PACU Anesthesia Type: General Level of consciousness: awake and alert Pain management: pain level controlled Vital Signs Assessment: post-procedure vital signs reviewed and stable Respiratory status: spontaneous breathing, nonlabored ventilation, respiratory function stable and patient connected to nasal cannula oxygen Cardiovascular status: blood pressure returned to baseline and stable Postop Assessment: no apparent nausea or vomiting Anesthetic complications: no    Last Vitals:  Vitals:   05/28/19 0900 05/28/19 0915  BP: 129/83 (!) 142/91  Pulse: (!) 52 (!) 55  Resp: 10 (!) 9  Temp:    SpO2: 100% 100%    Last Pain:  Vitals:   05/28/19 0915  TempSrc:   PainSc: 0-No pain                 Montez Hageman

## 2019-05-28 NOTE — Anesthesia Procedure Notes (Signed)
Procedure Name: Intubation Date/Time: 05/28/2019 7:28 AM Performed by: Eben Burow, CRNA Pre-anesthesia Checklist: Patient identified, Emergency Drugs available, Suction available, Patient being monitored and Timeout performed Patient Re-evaluated:Patient Re-evaluated prior to induction Oxygen Delivery Method: Circle system utilized Preoxygenation: Pre-oxygenation with 100% oxygen Induction Type: IV induction Ventilation: Mask ventilation without difficulty Laryngoscope Size: Mac and 4 Grade View: Grade I Tube type: Oral Tube size: 7.5 mm Number of attempts: 1 Airway Equipment and Method: Stylet Placement Confirmation: ETT inserted through vocal cords under direct vision,  positive ETCO2 and breath sounds checked- equal and bilateral Secured at: 22 cm Tube secured with: Tape Dental Injury: Teeth and Oropharynx as per pre-operative assessment

## 2019-05-28 NOTE — Anesthesia Preprocedure Evaluation (Signed)
Anesthesia Evaluation  Patient identified by MRN, date of birth, ID band Patient awake    Reviewed: Allergy & Precautions, NPO status , Patient's Chart, lab work & pertinent test results  Airway Mallampati: II  TM Distance: >3 FB Neck ROM: Full    Dental no notable dental hx.    Pulmonary sleep apnea and Continuous Positive Airway Pressure Ventilation , former smoker,    Pulmonary exam normal breath sounds clear to auscultation       Cardiovascular + DVT  Normal cardiovascular exam Rhythm:Regular Rate:Normal     Neuro/Psych negative neurological ROS  negative psych ROS   GI/Hepatic negative GI ROS, Neg liver ROS,   Endo/Other  negative endocrine ROS  Renal/GU negative Renal ROS  negative genitourinary   Musculoskeletal negative musculoskeletal ROS (+)   Abdominal   Peds negative pediatric ROS (+)  Hematology Factor V Leiden   Anesthesia Other Findings   Reproductive/Obstetrics negative OB ROS                             Anesthesia Physical Anesthesia Plan  ASA: II  Anesthesia Plan: General   Post-op Pain Management:    Induction: Intravenous  PONV Risk Score and Plan: 2 and Treatment may vary due to age or medical condition and Ondansetron  Airway Management Planned: LMA and Oral ETT  Additional Equipment:   Intra-op Plan:   Post-operative Plan: Extubation in OR  Informed Consent: I have reviewed the patients History and Physical, chart, labs and discussed the procedure including the risks, benefits and alternatives for the proposed anesthesia with the patient or authorized representative who has indicated his/her understanding and acceptance.     Dental advisory given  Plan Discussed with: CRNA  Anesthesia Plan Comments:         Anesthesia Quick Evaluation

## 2019-05-28 NOTE — Transfer of Care (Signed)
Immediate Anesthesia Transfer of Care Note  Patient: Shawn Farley  Procedure(s) Performed: OPEN REPAIR INCISIONAL HERNIA With MESH (N/A Abdomen)  Patient Location: PACU  Anesthesia Type:General  Level of Consciousness: awake, alert  and oriented  Airway & Oxygen Therapy: spont resp, face mask O2  Post-op Assessment: Report given to RN and Post -op Vital signs reviewed and stable  Post vital signs: Reviewed and stable  Last Vitals:  Vitals Value Taken Time  BP 147/89 05/28/19 0834  Temp    Pulse 70 05/28/19 0836  Resp 20 05/28/19 0836  SpO2 100 % 05/28/19 0836  Vitals shown include unvalidated device data.  Last Pain:  Vitals:   05/28/19 0545  TempSrc: Oral         Complications: No apparent anesthesia complications

## 2019-05-28 NOTE — Interval H&P Note (Signed)
History and Physical Interval Note:  05/28/2019 7:02 AM  [name]  has presented today for surgery, with the diagnosis of INCISIONAL HERNIA, INCARCERATED.  The various methods of treatment have been discussed with the patient and family. After consideration of risks, benefits and other options for treatment, the patient has consented to    Procedure(s): Shawn Farley (N/A) as a surgical intervention.    The patient's history has been reviewed, patient examined, no change in status, stable for surgery.  I have reviewed the patient's chart and labs.  Questions were answered to the patient's satisfaction.    Armandina Gemma, [place] [facility] Office: [place]

## 2019-05-31 ENCOUNTER — Encounter (HOSPITAL_COMMUNITY): Payer: Self-pay | Admitting: Surgery

## 2019-06-01 DIAGNOSIS — Z7901 Long term (current) use of anticoagulants: Secondary | ICD-10-CM | POA: Diagnosis not present

## 2019-06-07 DIAGNOSIS — Z7901 Long term (current) use of anticoagulants: Secondary | ICD-10-CM | POA: Diagnosis not present

## 2019-06-21 DIAGNOSIS — Z7901 Long term (current) use of anticoagulants: Secondary | ICD-10-CM | POA: Diagnosis not present

## 2019-06-24 DIAGNOSIS — I83891 Varicose veins of right lower extremities with other complications: Secondary | ICD-10-CM | POA: Diagnosis not present

## 2019-06-24 DIAGNOSIS — I83811 Varicose veins of right lower extremities with pain: Secondary | ICD-10-CM | POA: Diagnosis not present

## 2019-06-24 DIAGNOSIS — I8311 Varicose veins of right lower extremity with inflammation: Secondary | ICD-10-CM | POA: Diagnosis not present

## 2019-06-28 DIAGNOSIS — I8311 Varicose veins of right lower extremity with inflammation: Secondary | ICD-10-CM | POA: Diagnosis not present

## 2019-07-13 DIAGNOSIS — I83811 Varicose veins of right lower extremities with pain: Secondary | ICD-10-CM | POA: Diagnosis not present

## 2019-07-13 DIAGNOSIS — I8311 Varicose veins of right lower extremity with inflammation: Secondary | ICD-10-CM | POA: Diagnosis not present

## 2019-07-13 DIAGNOSIS — I83891 Varicose veins of right lower extremities with other complications: Secondary | ICD-10-CM | POA: Diagnosis not present

## 2019-07-19 DIAGNOSIS — Z7901 Long term (current) use of anticoagulants: Secondary | ICD-10-CM | POA: Diagnosis not present

## 2019-08-03 DIAGNOSIS — I83891 Varicose veins of right lower extremities with other complications: Secondary | ICD-10-CM | POA: Diagnosis not present

## 2019-08-03 DIAGNOSIS — I8311 Varicose veins of right lower extremity with inflammation: Secondary | ICD-10-CM | POA: Diagnosis not present

## 2019-08-12 DIAGNOSIS — Z125 Encounter for screening for malignant neoplasm of prostate: Secondary | ICD-10-CM | POA: Diagnosis not present

## 2019-08-12 DIAGNOSIS — G4733 Obstructive sleep apnea (adult) (pediatric): Secondary | ICD-10-CM | POA: Diagnosis not present

## 2019-08-12 DIAGNOSIS — D6851 Activated protein C resistance: Secondary | ICD-10-CM | POA: Diagnosis not present

## 2019-08-12 DIAGNOSIS — E7800 Pure hypercholesterolemia, unspecified: Secondary | ICD-10-CM | POA: Diagnosis not present

## 2019-08-12 DIAGNOSIS — Z0000 Encounter for general adult medical examination without abnormal findings: Secondary | ICD-10-CM | POA: Diagnosis not present

## 2019-08-23 ENCOUNTER — Other Ambulatory Visit: Payer: BLUE CROSS/BLUE SHIELD

## 2019-08-30 DIAGNOSIS — I8311 Varicose veins of right lower extremity with inflammation: Secondary | ICD-10-CM | POA: Diagnosis not present

## 2019-08-30 DIAGNOSIS — M7981 Nontraumatic hematoma of soft tissue: Secondary | ICD-10-CM | POA: Diagnosis not present

## 2019-09-08 ENCOUNTER — Ambulatory Visit: Payer: BC Managed Care – PPO | Attending: Internal Medicine

## 2019-09-08 DIAGNOSIS — Z23 Encounter for immunization: Secondary | ICD-10-CM | POA: Diagnosis not present

## 2019-09-08 NOTE — Progress Notes (Signed)
   Covid-19 Vaccination Clinic  Name:  TIQUAN BOUCH    MRN: [id number] DOB: Mar 05, 1951  09/08/2019  Mr. Grisso was observed post Covid-19 immunization for 30 minutes based on pre-vaccination screening without incidence. He was provided with Vaccine Information Sheet and instruction to access the V-Safe system.   Mr. Graumann was instructed to call 911 with any severe reactions post vaccine: Marland Kitchen Difficulty breathing  . Swelling of your face and throat  . A fast heartbeat  . A bad rash all over your body  . Dizziness and weakness    Immunizations Administered    Name Date Dose VIS Date Route   Pfizer COVID-19 Vaccine 09/08/2019 11:17 AM 0.3 mL 08/06/2019 Intramuscular   Manufacturer: [employer], [employer]   Lot: [id number]   NDC: [address]-4

## 2019-09-13 DIAGNOSIS — Z7901 Long term (current) use of anticoagulants: Secondary | ICD-10-CM | POA: Diagnosis not present

## 2019-09-14 DIAGNOSIS — I8312 Varicose veins of left lower extremity with inflammation: Secondary | ICD-10-CM | POA: Diagnosis not present

## 2019-09-17 DIAGNOSIS — I8312 Varicose veins of left lower extremity with inflammation: Secondary | ICD-10-CM | POA: Diagnosis not present

## 2019-09-28 ENCOUNTER — Ambulatory Visit: Payer: BC Managed Care – PPO | Attending: Internal Medicine

## 2019-09-28 DIAGNOSIS — Z23 Encounter for immunization: Secondary | ICD-10-CM

## 2019-09-28 NOTE — Progress Notes (Signed)
   Covid-19 Vaccination Clinic  Name:  Shawn Farley    MRN: [id number] DOB: 10-19-1950  09/28/2019  Shawn Farley was observed post Covid-19 immunization for 15 minutes without incidence. He was provided with Vaccine Information Sheet and instruction to access the V-Safe system.   Shawn Farley was instructed to call 911 with any severe reactions post vaccine: Marland Kitchen Difficulty breathing  . Swelling of your face and throat  . A fast heartbeat  . A bad rash all over your body  . Dizziness and weakness    Immunizations Administered    Name Date Dose VIS Date Route   Pfizer COVID-19 Vaccine 09/28/2019  8:55 AM 0.3 mL 08/06/2019 Intramuscular   Manufacturer: [employer], [employer]   Lot: [id number]   NDC: [address]-5

## 2019-09-30 DIAGNOSIS — I8312 Varicose veins of left lower extremity with inflammation: Secondary | ICD-10-CM | POA: Diagnosis not present

## 2019-10-11 DIAGNOSIS — Z7901 Long term (current) use of anticoagulants: Secondary | ICD-10-CM | POA: Diagnosis not present

## 2019-10-12 DIAGNOSIS — I8312 Varicose veins of left lower extremity with inflammation: Secondary | ICD-10-CM | POA: Diagnosis not present

## 2019-10-12 DIAGNOSIS — I83812 Varicose veins of left lower extremities with pain: Secondary | ICD-10-CM | POA: Diagnosis not present

## 2019-10-27 DIAGNOSIS — I8312 Varicose veins of left lower extremity with inflammation: Secondary | ICD-10-CM | POA: Diagnosis not present

## 2019-11-10 DIAGNOSIS — Z7901 Long term (current) use of anticoagulants: Secondary | ICD-10-CM | POA: Diagnosis not present

## 2019-12-10 DIAGNOSIS — H5213 Myopia, bilateral: Secondary | ICD-10-CM | POA: Diagnosis not present

## 2019-12-14 DIAGNOSIS — Z7901 Long term (current) use of anticoagulants: Secondary | ICD-10-CM | POA: Diagnosis not present

## 2020-01-13 DIAGNOSIS — Z7901 Long term (current) use of anticoagulants: Secondary | ICD-10-CM | POA: Diagnosis not present

## 2020-02-01 DIAGNOSIS — I8312 Varicose veins of left lower extremity with inflammation: Secondary | ICD-10-CM | POA: Diagnosis not present

## 2020-02-01 DIAGNOSIS — I8311 Varicose veins of right lower extremity with inflammation: Secondary | ICD-10-CM | POA: Diagnosis not present

## 2020-02-10 DIAGNOSIS — Z7901 Long term (current) use of anticoagulants: Secondary | ICD-10-CM | POA: Diagnosis not present

## 2020-02-24 DIAGNOSIS — G4733 Obstructive sleep apnea (adult) (pediatric): Secondary | ICD-10-CM | POA: Diagnosis not present

## 2020-03-13 DIAGNOSIS — Z7901 Long term (current) use of anticoagulants: Secondary | ICD-10-CM | POA: Diagnosis not present

## 2020-04-10 DIAGNOSIS — Z7901 Long term (current) use of anticoagulants: Secondary | ICD-10-CM | POA: Diagnosis not present

## 2020-04-12 DIAGNOSIS — G4733 Obstructive sleep apnea (adult) (pediatric): Secondary | ICD-10-CM | POA: Diagnosis not present

## 2020-04-12 DIAGNOSIS — R5382 Chronic fatigue, unspecified: Secondary | ICD-10-CM | POA: Diagnosis not present

## 2020-05-23 DIAGNOSIS — Z5181 Encounter for therapeutic drug level monitoring: Secondary | ICD-10-CM | POA: Diagnosis not present

## 2020-06-20 DIAGNOSIS — Z5181 Encounter for therapeutic drug level monitoring: Secondary | ICD-10-CM | POA: Diagnosis not present

## 2020-07-14 DIAGNOSIS — Z7901 Long term (current) use of anticoagulants: Secondary | ICD-10-CM | POA: Diagnosis not present

## 2020-08-14 DIAGNOSIS — Z7901 Long term (current) use of anticoagulants: Secondary | ICD-10-CM | POA: Diagnosis not present

## 2020-09-05 DIAGNOSIS — Z0000 Encounter for general adult medical examination without abnormal findings: Secondary | ICD-10-CM | POA: Diagnosis not present

## 2020-09-05 DIAGNOSIS — R109 Unspecified abdominal pain: Secondary | ICD-10-CM | POA: Diagnosis not present

## 2020-09-05 DIAGNOSIS — D6851 Activated protein C resistance: Secondary | ICD-10-CM | POA: Diagnosis not present

## 2020-09-05 DIAGNOSIS — G4733 Obstructive sleep apnea (adult) (pediatric): Secondary | ICD-10-CM | POA: Diagnosis not present

## 2020-09-05 DIAGNOSIS — Z125 Encounter for screening for malignant neoplasm of prostate: Secondary | ICD-10-CM | POA: Diagnosis not present

## 2020-09-05 DIAGNOSIS — E7800 Pure hypercholesterolemia, unspecified: Secondary | ICD-10-CM | POA: Diagnosis not present

## 2020-09-05 DIAGNOSIS — Z7901 Long term (current) use of anticoagulants: Secondary | ICD-10-CM | POA: Diagnosis not present

## 2020-10-10 DIAGNOSIS — Z7901 Long term (current) use of anticoagulants: Secondary | ICD-10-CM | POA: Diagnosis not present

## 2020-11-08 DIAGNOSIS — Z5181 Encounter for therapeutic drug level monitoring: Secondary | ICD-10-CM | POA: Diagnosis not present

## 2021-01-15 DIAGNOSIS — E785 Hyperlipidemia, unspecified: Secondary | ICD-10-CM | POA: Diagnosis not present

## 2021-01-15 DIAGNOSIS — Z1159 Encounter for screening for other viral diseases: Secondary | ICD-10-CM | POA: Diagnosis not present

## 2021-01-15 DIAGNOSIS — N529 Male erectile dysfunction, unspecified: Secondary | ICD-10-CM | POA: Diagnosis not present

## 2021-01-15 DIAGNOSIS — D6851 Activated protein C resistance: Secondary | ICD-10-CM | POA: Diagnosis not present

## 2021-01-15 DIAGNOSIS — Z7689 Persons encountering health services in other specified circumstances: Secondary | ICD-10-CM | POA: Diagnosis not present

## 2021-01-15 DIAGNOSIS — G4730 Sleep apnea, unspecified: Secondary | ICD-10-CM | POA: Diagnosis not present

## 2021-01-15 DIAGNOSIS — Z7901 Long term (current) use of anticoagulants: Secondary | ICD-10-CM | POA: Diagnosis not present

## 2021-01-15 DIAGNOSIS — Z114 Encounter for screening for human immunodeficiency virus [HIV]: Secondary | ICD-10-CM | POA: Diagnosis not present

## 2021-01-15 DIAGNOSIS — E559 Vitamin D deficiency, unspecified: Secondary | ICD-10-CM | POA: Diagnosis not present

## 2021-01-25 DIAGNOSIS — G4733 Obstructive sleep apnea (adult) (pediatric): Secondary | ICD-10-CM | POA: Diagnosis not present

## 2021-02-15 DIAGNOSIS — D6851 Activated protein C resistance: Secondary | ICD-10-CM | POA: Diagnosis not present

## 2021-02-15 DIAGNOSIS — Z7901 Long term (current) use of anticoagulants: Secondary | ICD-10-CM | POA: Diagnosis not present

## 2021-03-19 DIAGNOSIS — D6851 Activated protein C resistance: Secondary | ICD-10-CM | POA: Diagnosis not present

## 2021-03-19 DIAGNOSIS — Z7901 Long term (current) use of anticoagulants: Secondary | ICD-10-CM | POA: Diagnosis not present

## 2021-04-16 DIAGNOSIS — E7800 Pure hypercholesterolemia, unspecified: Secondary | ICD-10-CM | POA: Diagnosis not present

## 2021-04-16 DIAGNOSIS — D6851 Activated protein C resistance: Secondary | ICD-10-CM | POA: Diagnosis not present

## 2021-04-16 DIAGNOSIS — N529 Male erectile dysfunction, unspecified: Secondary | ICD-10-CM | POA: Diagnosis not present

## 2021-04-16 DIAGNOSIS — R109 Unspecified abdominal pain: Secondary | ICD-10-CM | POA: Diagnosis not present

## 2021-04-26 DIAGNOSIS — R6889 Other general symptoms and signs: Secondary | ICD-10-CM | POA: Diagnosis not present

## 2021-05-11 DIAGNOSIS — D225 Melanocytic nevi of trunk: Secondary | ICD-10-CM | POA: Diagnosis not present

## 2021-05-11 DIAGNOSIS — L578 Other skin changes due to chronic exposure to nonionizing radiation: Secondary | ICD-10-CM | POA: Diagnosis not present

## 2021-05-11 DIAGNOSIS — L821 Other seborrheic keratosis: Secondary | ICD-10-CM | POA: Diagnosis not present

## 2021-05-11 DIAGNOSIS — D1801 Hemangioma of skin and subcutaneous tissue: Secondary | ICD-10-CM | POA: Diagnosis not present

## 2021-05-16 DIAGNOSIS — D6851 Activated protein C resistance: Secondary | ICD-10-CM | POA: Diagnosis not present

## 2021-06-26 DIAGNOSIS — D6851 Activated protein C resistance: Secondary | ICD-10-CM | POA: Diagnosis not present

## 2021-08-01 DIAGNOSIS — D6851 Activated protein C resistance: Secondary | ICD-10-CM | POA: Diagnosis not present

## 2021-09-06 DIAGNOSIS — I82519 Chronic embolism and thrombosis of unspecified femoral vein: Secondary | ICD-10-CM | POA: Diagnosis not present

## 2021-09-06 DIAGNOSIS — K5669 Other partial intestinal obstruction: Secondary | ICD-10-CM | POA: Diagnosis not present

## 2021-09-06 DIAGNOSIS — Z0000 Encounter for general adult medical examination without abnormal findings: Secondary | ICD-10-CM | POA: Diagnosis not present

## 2021-09-06 DIAGNOSIS — E559 Vitamin D deficiency, unspecified: Secondary | ICD-10-CM | POA: Diagnosis not present

## 2021-09-06 DIAGNOSIS — I10 Essential (primary) hypertension: Secondary | ICD-10-CM | POA: Diagnosis not present

## 2021-09-06 DIAGNOSIS — Z125 Encounter for screening for malignant neoplasm of prostate: Secondary | ICD-10-CM | POA: Diagnosis not present

## 2021-09-06 DIAGNOSIS — E538 Deficiency of other specified B group vitamins: Secondary | ICD-10-CM | POA: Diagnosis not present

## 2021-09-06 DIAGNOSIS — E7800 Pure hypercholesterolemia, unspecified: Secondary | ICD-10-CM | POA: Diagnosis not present

## 2021-09-06 DIAGNOSIS — D6851 Activated protein C resistance: Secondary | ICD-10-CM | POA: Diagnosis not present

## 2021-09-13 DIAGNOSIS — M19012 Primary osteoarthritis, left shoulder: Secondary | ICD-10-CM | POA: Diagnosis not present

## 2021-09-13 DIAGNOSIS — M25512 Pain in left shoulder: Secondary | ICD-10-CM | POA: Diagnosis not present

## 2021-10-11 DIAGNOSIS — D6851 Activated protein C resistance: Secondary | ICD-10-CM | POA: Diagnosis not present

## 2021-10-30 DIAGNOSIS — G4733 Obstructive sleep apnea (adult) (pediatric): Secondary | ICD-10-CM | POA: Diagnosis not present

## 2021-12-04 DIAGNOSIS — H18593 Other hereditary corneal dystrophies, bilateral: Secondary | ICD-10-CM | POA: Diagnosis not present

## 2021-12-04 DIAGNOSIS — H2513 Age-related nuclear cataract, bilateral: Secondary | ICD-10-CM | POA: Diagnosis not present

## 2022-01-08 DIAGNOSIS — Z125 Encounter for screening for malignant neoplasm of prostate: Secondary | ICD-10-CM | POA: Diagnosis not present

## 2022-01-08 DIAGNOSIS — D6851 Activated protein C resistance: Secondary | ICD-10-CM | POA: Diagnosis not present

## 2022-01-08 DIAGNOSIS — E7800 Pure hypercholesterolemia, unspecified: Secondary | ICD-10-CM | POA: Diagnosis not present

## 2022-01-08 DIAGNOSIS — I10 Essential (primary) hypertension: Secondary | ICD-10-CM | POA: Diagnosis not present

## 2022-03-12 DIAGNOSIS — D6851 Activated protein C resistance: Secondary | ICD-10-CM | POA: Diagnosis not present

## 2022-04-23 DIAGNOSIS — Z6827 Body mass index (BMI) 27.0-27.9, adult: Secondary | ICD-10-CM | POA: Diagnosis not present

## 2022-04-23 DIAGNOSIS — M7989 Other specified soft tissue disorders: Secondary | ICD-10-CM | POA: Diagnosis not present

## 2022-04-23 DIAGNOSIS — M79674 Pain in right toe(s): Secondary | ICD-10-CM | POA: Diagnosis not present

## 2022-04-23 DIAGNOSIS — M5416 Radiculopathy, lumbar region: Secondary | ICD-10-CM | POA: Diagnosis not present

## 2022-04-23 DIAGNOSIS — S99921A Unspecified injury of right foot, initial encounter: Secondary | ICD-10-CM | POA: Diagnosis not present

## 2022-05-21 DIAGNOSIS — D6851 Activated protein C resistance: Secondary | ICD-10-CM | POA: Diagnosis not present

## 2022-06-04 DIAGNOSIS — L578 Other skin changes due to chronic exposure to nonionizing radiation: Secondary | ICD-10-CM | POA: Diagnosis not present

## 2022-06-04 DIAGNOSIS — L821 Other seborrheic keratosis: Secondary | ICD-10-CM | POA: Diagnosis not present

## 2022-06-04 DIAGNOSIS — D485 Neoplasm of uncertain behavior of skin: Secondary | ICD-10-CM | POA: Diagnosis not present

## 2022-06-04 DIAGNOSIS — L812 Freckles: Secondary | ICD-10-CM | POA: Diagnosis not present

## 2022-06-26 DIAGNOSIS — L608 Other nail disorders: Secondary | ICD-10-CM | POA: Diagnosis not present

## 2022-06-26 DIAGNOSIS — E7800 Pure hypercholesterolemia, unspecified: Secondary | ICD-10-CM | POA: Diagnosis not present

## 2022-06-26 DIAGNOSIS — I10 Essential (primary) hypertension: Secondary | ICD-10-CM | POA: Diagnosis not present

## 2022-06-26 DIAGNOSIS — D6851 Activated protein C resistance: Secondary | ICD-10-CM | POA: Diagnosis not present

## 2022-08-20 DIAGNOSIS — R002 Palpitations: Secondary | ICD-10-CM | POA: Diagnosis not present

## 2022-08-20 DIAGNOSIS — E7800 Pure hypercholesterolemia, unspecified: Secondary | ICD-10-CM | POA: Diagnosis not present

## 2022-08-20 DIAGNOSIS — D6851 Activated protein C resistance: Secondary | ICD-10-CM | POA: Diagnosis not present

## 2022-08-20 DIAGNOSIS — I10 Essential (primary) hypertension: Secondary | ICD-10-CM | POA: Diagnosis not present

## 2022-09-23 DIAGNOSIS — M25512 Pain in left shoulder: Secondary | ICD-10-CM | POA: Diagnosis not present

## 2022-09-23 DIAGNOSIS — M75122 Complete rotator cuff tear or rupture of left shoulder, not specified as traumatic: Secondary | ICD-10-CM | POA: Diagnosis not present

## 2022-09-23 DIAGNOSIS — M7552 Bursitis of left shoulder: Secondary | ICD-10-CM | POA: Diagnosis not present

## 2022-10-02 DIAGNOSIS — D6851 Activated protein C resistance: Secondary | ICD-10-CM | POA: Diagnosis not present

## 2022-10-08 DIAGNOSIS — M25512 Pain in left shoulder: Secondary | ICD-10-CM | POA: Diagnosis not present

## 2022-10-15 DIAGNOSIS — M7552 Bursitis of left shoulder: Secondary | ICD-10-CM | POA: Diagnosis not present

## 2022-10-15 DIAGNOSIS — M75122 Complete rotator cuff tear or rupture of left shoulder, not specified as traumatic: Secondary | ICD-10-CM | POA: Diagnosis not present

## 2022-10-15 DIAGNOSIS — M25512 Pain in left shoulder: Secondary | ICD-10-CM | POA: Diagnosis not present

## 2022-12-02 DIAGNOSIS — M25512 Pain in left shoulder: Secondary | ICD-10-CM | POA: Diagnosis not present

## 2022-12-02 DIAGNOSIS — M75122 Complete rotator cuff tear or rupture of left shoulder, not specified as traumatic: Secondary | ICD-10-CM | POA: Diagnosis not present

## 2022-12-02 DIAGNOSIS — M7552 Bursitis of left shoulder: Secondary | ICD-10-CM | POA: Diagnosis not present

## 2022-12-31 DIAGNOSIS — E559 Vitamin D deficiency, unspecified: Secondary | ICD-10-CM | POA: Diagnosis not present

## 2022-12-31 DIAGNOSIS — E7800 Pure hypercholesterolemia, unspecified: Secondary | ICD-10-CM | POA: Diagnosis not present

## 2022-12-31 DIAGNOSIS — Z125 Encounter for screening for malignant neoplasm of prostate: Secondary | ICD-10-CM | POA: Diagnosis not present

## 2022-12-31 DIAGNOSIS — I10 Essential (primary) hypertension: Secondary | ICD-10-CM | POA: Diagnosis not present

## 2022-12-31 DIAGNOSIS — D6851 Activated protein C resistance: Secondary | ICD-10-CM | POA: Diagnosis not present

## 2022-12-31 DIAGNOSIS — L608 Other nail disorders: Secondary | ICD-10-CM | POA: Diagnosis not present

## 2023-02-18 DIAGNOSIS — N419 Inflammatory disease of prostate, unspecified: Secondary | ICD-10-CM | POA: Diagnosis not present

## 2023-02-18 DIAGNOSIS — R972 Elevated prostate specific antigen [PSA]: Secondary | ICD-10-CM | POA: Diagnosis not present

## 2023-02-25 DIAGNOSIS — D6851 Activated protein C resistance: Secondary | ICD-10-CM | POA: Diagnosis not present

## 2023-02-25 DIAGNOSIS — E785 Hyperlipidemia, unspecified: Secondary | ICD-10-CM | POA: Diagnosis not present

## 2023-02-25 DIAGNOSIS — R112 Nausea with vomiting, unspecified: Secondary | ICD-10-CM | POA: Diagnosis not present

## 2023-02-25 DIAGNOSIS — D72829 Elevated white blood cell count, unspecified: Secondary | ICD-10-CM | POA: Diagnosis not present

## 2023-02-25 DIAGNOSIS — R1013 Epigastric pain: Secondary | ICD-10-CM | POA: Diagnosis not present

## 2023-02-25 DIAGNOSIS — N400 Enlarged prostate without lower urinary tract symptoms: Secondary | ICD-10-CM | POA: Diagnosis not present

## 2023-02-25 DIAGNOSIS — N323 Diverticulum of bladder: Secondary | ICD-10-CM | POA: Diagnosis not present

## 2023-02-25 DIAGNOSIS — K4090 Unilateral inguinal hernia, without obstruction or gangrene, not specified as recurrent: Secondary | ICD-10-CM | POA: Diagnosis not present

## 2023-02-25 DIAGNOSIS — K5651 Intestinal adhesions [bands], with partial obstruction: Secondary | ICD-10-CM | POA: Diagnosis not present

## 2023-02-25 DIAGNOSIS — K56600 Partial intestinal obstruction, unspecified as to cause: Secondary | ICD-10-CM | POA: Diagnosis not present

## 2023-02-25 DIAGNOSIS — I10 Essential (primary) hypertension: Secondary | ICD-10-CM | POA: Diagnosis not present

## 2023-02-25 DIAGNOSIS — K5730 Diverticulosis of large intestine without perforation or abscess without bleeding: Secondary | ICD-10-CM | POA: Diagnosis not present

## 2023-02-25 DIAGNOSIS — Z7901 Long term (current) use of anticoagulants: Secondary | ICD-10-CM | POA: Diagnosis not present

## 2023-02-25 DIAGNOSIS — K56609 Unspecified intestinal obstruction, unspecified as to partial versus complete obstruction: Secondary | ICD-10-CM | POA: Diagnosis not present

## 2023-03-12 DIAGNOSIS — K56609 Unspecified intestinal obstruction, unspecified as to partial versus complete obstruction: Secondary | ICD-10-CM | POA: Diagnosis not present

## 2023-03-12 DIAGNOSIS — E7800 Pure hypercholesterolemia, unspecified: Secondary | ICD-10-CM | POA: Diagnosis not present

## 2023-03-12 DIAGNOSIS — I10 Essential (primary) hypertension: Secondary | ICD-10-CM | POA: Diagnosis not present

## 2023-03-12 DIAGNOSIS — D6851 Activated protein C resistance: Secondary | ICD-10-CM | POA: Diagnosis not present

## 2023-03-25 DIAGNOSIS — R972 Elevated prostate specific antigen [PSA]: Secondary | ICD-10-CM | POA: Diagnosis not present

## 2023-05-20 DIAGNOSIS — N419 Inflammatory disease of prostate, unspecified: Secondary | ICD-10-CM | POA: Diagnosis not present

## 2023-05-20 DIAGNOSIS — R972 Elevated prostate specific antigen [PSA]: Secondary | ICD-10-CM | POA: Diagnosis not present

## 2023-06-05 DIAGNOSIS — L578 Other skin changes due to chronic exposure to nonionizing radiation: Secondary | ICD-10-CM | POA: Diagnosis not present

## 2023-06-05 DIAGNOSIS — D485 Neoplasm of uncertain behavior of skin: Secondary | ICD-10-CM | POA: Diagnosis not present

## 2023-06-05 DIAGNOSIS — D225 Melanocytic nevi of trunk: Secondary | ICD-10-CM | POA: Diagnosis not present

## 2023-06-05 DIAGNOSIS — D1801 Hemangioma of skin and subcutaneous tissue: Secondary | ICD-10-CM | POA: Diagnosis not present

## 2023-06-18 DIAGNOSIS — I82402 Acute embolism and thrombosis of unspecified deep veins of left lower extremity: Secondary | ICD-10-CM | POA: Diagnosis not present

## 2023-07-03 DIAGNOSIS — N529 Male erectile dysfunction, unspecified: Secondary | ICD-10-CM | POA: Diagnosis not present

## 2023-07-03 DIAGNOSIS — I10 Essential (primary) hypertension: Secondary | ICD-10-CM | POA: Diagnosis not present

## 2023-07-03 DIAGNOSIS — D6851 Activated protein C resistance: Secondary | ICD-10-CM | POA: Diagnosis not present

## 2023-07-03 DIAGNOSIS — E7800 Pure hypercholesterolemia, unspecified: Secondary | ICD-10-CM | POA: Diagnosis not present

## 2023-08-05 DIAGNOSIS — R972 Elevated prostate specific antigen [PSA]: Secondary | ICD-10-CM | POA: Diagnosis not present

## 2023-08-12 DIAGNOSIS — R972 Elevated prostate specific antigen [PSA]: Secondary | ICD-10-CM | POA: Diagnosis not present
